# Patient Record
Sex: Male | Born: 1945 | Race: White | Hispanic: No | Marital: Married | Smoking: Never smoker
Health system: Southern US, Community
[De-identification: ages and names within clinical notes are randomized; demographics above are authoritative.]

## PROBLEM LIST (undated history)

## (undated) DIAGNOSIS — E119 Type 2 diabetes mellitus without complications: Secondary | ICD-10-CM

## (undated) DIAGNOSIS — G4730 Sleep apnea, unspecified: Secondary | ICD-10-CM

## (undated) DIAGNOSIS — N400 Enlarged prostate without lower urinary tract symptoms: Secondary | ICD-10-CM

## (undated) DIAGNOSIS — I10 Essential (primary) hypertension: Secondary | ICD-10-CM

## (undated) DIAGNOSIS — E785 Hyperlipidemia, unspecified: Secondary | ICD-10-CM

## (undated) DIAGNOSIS — J189 Pneumonia, unspecified organism: Secondary | ICD-10-CM

## (undated) DIAGNOSIS — H269 Unspecified cataract: Secondary | ICD-10-CM

## (undated) HISTORY — PX: PROSTATE SURGERY: SHX751

## (undated) HISTORY — DX: Unspecified cataract: H26.9

## (undated) HISTORY — DX: Benign prostatic hyperplasia without lower urinary tract symptoms: N40.0

## (undated) HISTORY — DX: Essential (primary) hypertension: I10

## (undated) HISTORY — DX: Pneumonia, unspecified organism: J18.9

## (undated) HISTORY — PX: CHOLECYSTECTOMY: SHX55

## (undated) HISTORY — DX: Type 2 diabetes mellitus without complications: E11.9

## (undated) HISTORY — DX: Hyperlipidemia, unspecified: E78.5

---

## 2004-08-01 ENCOUNTER — Ambulatory Visit: Payer: Self-pay | Admitting: Internal Medicine

## 2013-02-11 DIAGNOSIS — J189 Pneumonia, unspecified organism: Secondary | ICD-10-CM

## 2013-02-11 HISTORY — PX: EYE SURGERY: SHX253

## 2013-02-11 HISTORY — DX: Pneumonia, unspecified organism: J18.9

## 2013-03-11 LAB — CBC WITH DIFFERENTIAL/PLATELET
BASOS PCT: 0.6 %
Basophil #: 0.1 10*3/uL (ref 0.0–0.1)
EOS ABS: 0.1 10*3/uL (ref 0.0–0.7)
Eosinophil %: 0.7 %
HCT: 37.9 % — ABNORMAL LOW (ref 40.0–52.0)
HGB: 12.2 g/dL — ABNORMAL LOW (ref 13.0–18.0)
Lymphocyte #: 0.7 10*3/uL — ABNORMAL LOW (ref 1.0–3.6)
Lymphocyte %: 7.2 %
MCH: 28.9 pg (ref 26.0–34.0)
MCHC: 32.2 g/dL (ref 32.0–36.0)
MCV: 90 fL (ref 80–100)
MONO ABS: 0.8 x10 3/mm (ref 0.2–1.0)
MONOS PCT: 8.4 %
Neutrophil #: 8.4 10*3/uL — ABNORMAL HIGH (ref 1.4–6.5)
Neutrophil %: 83.1 %
Platelet: 204 10*3/uL (ref 150–440)
RBC: 4.23 10*6/uL — ABNORMAL LOW (ref 4.40–5.90)
RDW: 15.3 % — AB (ref 11.5–14.5)
WBC: 10.1 10*3/uL (ref 3.8–10.6)

## 2013-03-11 LAB — COMPREHENSIVE METABOLIC PANEL
ALK PHOS: 77 U/L
ANION GAP: 6 — AB (ref 7–16)
Albumin: 3.4 g/dL (ref 3.4–5.0)
BUN: 15 mg/dL (ref 7–18)
Bilirubin,Total: 0.7 mg/dL (ref 0.2–1.0)
Calcium, Total: 8.5 mg/dL (ref 8.5–10.1)
Chloride: 101 mmol/L (ref 98–107)
Co2: 26 mmol/L (ref 21–32)
Creatinine: 1.18 mg/dL (ref 0.60–1.30)
EGFR (African American): >60
EGFR (Non-African Amer.): >60
GLUCOSE: 250 mg/dL — AB (ref 65–99)
Osmolality: 276 (ref 275–301)
POTASSIUM: 3.9 mmol/L (ref 3.5–5.1)
SGOT(AST): 32 U/L (ref 15–37)
SGPT (ALT): 46 U/L (ref 12–78)
Sodium: 133 mmol/L — ABNORMAL LOW (ref 136–145)
Total Protein: 7 g/dL (ref 6.4–8.2)

## 2013-03-11 LAB — RAPID INFLUENZA A&B ANTIGENS (ARMC ONLY)

## 2013-03-11 LAB — TROPONIN I: Troponin-I: <0.02 ng/mL

## 2013-03-12 ENCOUNTER — Inpatient Hospital Stay: Payer: Self-pay | Admitting: Internal Medicine

## 2013-03-12 LAB — URINALYSIS, COMPLETE
Bacteria: NONE SEEN
Bilirubin,UR: NEGATIVE
Blood: NEGATIVE
Glucose,UR: >=500 mg/dL (ref 0–75)
Leukocyte Esterase: NEGATIVE
NITRITE: NEGATIVE
Ph: 5 (ref 4.5–8.0)
Protein: NEGATIVE
RBC,UR: <1 /HPF (ref 0–5)
SPECIFIC GRAVITY: 1.026 (ref 1.003–1.030)
WBC UR: NONE SEEN /HPF (ref 0–5)

## 2013-03-13 LAB — URINE CULTURE

## 2013-03-16 LAB — CULTURE, BLOOD (SINGLE)

## 2013-03-16 LAB — EXPECTORATED SPUTUM ASSESSMENT W REFEX TO RESP CULTURE

## 2013-03-26 ENCOUNTER — Ambulatory Visit: Payer: Self-pay

## 2013-03-26 ENCOUNTER — Ambulatory Visit: Payer: Self-pay | Admitting: Internal Medicine

## 2013-04-14 ENCOUNTER — Ambulatory Visit: Payer: Self-pay | Admitting: Ophthalmology

## 2013-06-30 ENCOUNTER — Ambulatory Visit: Payer: Self-pay | Admitting: Ophthalmology

## 2013-09-23 ENCOUNTER — Ambulatory Visit: Payer: Self-pay | Admitting: Urology

## 2013-09-23 LAB — BASIC METABOLIC PANEL
Anion Gap: 6 — ABNORMAL LOW (ref 7–16)
BUN: 12 mg/dL (ref 7–18)
Calcium, Total: 9 mg/dL (ref 8.5–10.1)
Chloride: 105 mmol/L (ref 98–107)
Co2: 28 mmol/L (ref 21–32)
Creatinine: 1.2 mg/dL (ref 0.60–1.30)
EGFR (African American): >60
Glucose: 103 mg/dL — ABNORMAL HIGH (ref 65–99)
Osmolality: 278 (ref 275–301)
Potassium: 3.9 mmol/L (ref 3.5–5.1)
Sodium: 139 mmol/L (ref 136–145)

## 2013-09-23 LAB — CBC
HCT: 39 % — AB (ref 40.0–52.0)
HGB: 12.7 g/dL — ABNORMAL LOW (ref 13.0–18.0)
MCH: 28.1 pg (ref 26.0–34.0)
MCHC: 32.6 g/dL (ref 32.0–36.0)
MCV: 86 fL (ref 80–100)
PLATELETS: 250 10*3/uL (ref 150–440)
RBC: 4.52 10*6/uL (ref 4.40–5.90)
RDW: 16.9 % — ABNORMAL HIGH (ref 11.5–14.5)
WBC: 6.1 10*3/uL (ref 3.8–10.6)

## 2013-10-08 ENCOUNTER — Ambulatory Visit: Payer: Self-pay | Admitting: Urology

## 2013-10-08 LAB — BASIC METABOLIC PANEL
Anion Gap: 6 — ABNORMAL LOW (ref 7–16)
BUN: 14 mg/dL (ref 7–18)
Calcium, Total: 8 mg/dL — ABNORMAL LOW (ref 8.5–10.1)
Chloride: 112 mmol/L — ABNORMAL HIGH (ref 98–107)
Co2: 23 mmol/L (ref 21–32)
Creatinine: 0.9 mg/dL (ref 0.60–1.30)
EGFR (Non-African Amer.): >60
GLUCOSE: 188 mg/dL — AB (ref 65–99)
Osmolality: 287 (ref 275–301)
POTASSIUM: 4.2 mmol/L (ref 3.5–5.1)
Sodium: 141 mmol/L (ref 136–145)

## 2013-10-14 LAB — PATHOLOGY REPORT

## 2014-05-27 ENCOUNTER — Ambulatory Visit
Admit: 2014-05-27 | Disposition: A | Discharge status: Home / Self Care | Payer: Self-pay | Attending: Internal Medicine | Admitting: Internal Medicine

## 2014-06-01 ENCOUNTER — Encounter: Payer: Self-pay | Admitting: *Deleted

## 2014-06-04 NOTE — Op Note (Signed)
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    SURGEON:  Zebulon Gantt D. Elnoria Howard, M.D.   PROCEDURE: Cystourethroscopy with KTP laser ablation of the prostate followed by a saline resection and control of bleeding. The patient was sterilely prepped and draped in the supine lithotomy position.  After appropriate timeout and with good relaxation from a general anesthetic, we began. He appears to just have a very large middle lobe with minimal lateral lobe hypertrophy.  I began the KTP laser on the middle lobe; however, there was some bleeding as we progress with this and I no longer could see so I used a saline resectoscope and resected the tissue with control of bleeding. At the end of the procedure, the bleeding was controlled and the middle lobe was pretty well-resected as was some of the lateral lobe. We kept the verumontanum in sight throughout and did not progress toward the ureters at all. There was good resection down to the base of the tissue. The bleeding seemed to be controlled.  I used both the  loop and the button to control bleeding. Then I was able to put a 24 [place] 3-way catheter in to irrigation.  The irrigation was light pink to clear at the end so we sent him to recovery in satisfactory condition.    Blood loss was about 40 mL.   Rectal exam for B and O suppository at the end reveals an enlarged prostate, grade 2/4, but benign; no nodules, masses, or growths and no rectal masses.    ____________________________ Janice Coffin. Elnoria Howard, DO rdh:nr D: 10/08/2013 16:14:42 ET T: 10/08/2013 22:28:56 ET JOB#: [id number]  cc: Janice Coffin. Elnoria Howard, DO, <Dictator> Talayeh Bruinsma D Sheza Strickland DO ELECTRONICALLY SIGNED 10/22/2013 15:05

## 2014-06-04 NOTE — Discharge Summary (Signed)
PATIENT NAME:  Andre Edwards, Andre Edwards MR#:  [id number] DATE OF BIRTH:  [birth year]  DATE OF ADMISSION:  03/12/2013 DATE OF DISCHARGE:  03/14/2013  FINAL DIAGNOSES: 1. Pneumonia.  2. Acute respiratory failure secondary to #1.  3. Adult onset diabetes mellitus, uncontrolled.  4. Hypertension.   HISTORY AND PHYSICAL: Please see dictated admission history and physical.   HOSPITAL COURSE: The patient was admitted with evidence of pneumonia on chest x-ray, left lower lobe infiltrate, with 4 to 5 days of worsening symptoms. At the time of admission he was noted to have heart rate over 90, respiratory rate 20, and required 3 liters nasal cannula for support. Fortunately, he responded rapidly to nebulizer treatments, antibiotics. He was weaned off of oxygen, and placed on nebulizers only as needed, and change over to oral antibiotics. He tolerated this well also. He was ambulated personally, and did well with this, feeling like he was close to baseline ambulation. He had minimal cough, and breathing was markedly improved. He felt ready to go home and we discharged him in stable condition with physical activity to be up as tolerated. We will have him out of work for one week, he will follow-up with Dr. Doy Hutching in the next one week. He should follow a 2 gram sodium, 1800 calorie ADA diet. He should check his sugar daily and record this.   DISCHARGE MEDICATIONS: 1. Cardizem CD 240 mg p.o. daily.  2. Glipizide 10 mg p.o. daily.  3. Metformin 1000 mg p.o. b.Edwards.d.  4. Hydrochlorothiazide  25 mg p.o. daily.  5. Atorvastatin 10 mg p.o. at bedtime.  6. Levaquin 500 mg p.o. daily x 8 days course.  7. Ciprodex otic two drops to the ear 4 times a day x 5 days.  8. Albuterol metered-dose inhaler 2 puffs 4 times a day as needed for wheezing.   ____________________________ Adin Hector, MD bjk:sg D: 03/14/2013 09:14:10 ET T: 03/14/2013 09:57:54 ET JOB#: [id number]  cc: Adin Hector, MD, <Dictator> Leonie Douglas.  Doy Hutching, MD  Ramonita Lab MD ELECTRONICALLY SIGNED 03/15/2013 20:44

## 2014-06-04 NOTE — H&P (Signed)
PATIENT NAME:  Andre Edwards, Andre Edwards MR#:  [id number] DATE OF BIRTH:  February 18, 1945  DATE OF ADMISSION:  03/12/2013  REFERRING PHYSICIAN:  Dr. Cinda Quest.   PRIMARY CARE PHYSICIAN: Nonlocal.   CHIEF COMPLAINT: Shortness of breath.   HISTORY OF PRESENT ILLNESS: A 69 year old gentleman with history of hypertension, type 2 diabetes non-insulin requiring, presented with shortness of breath, describes 4 to 5 day duration of cough which is productive of sputum. He is unsure of the exact quality and nature of sputum as he does not look at it. However, his symptoms have been worsening. He now notes a one day duration of associated fevers and chills. He has noted positive sick contacts of multiple family members, supposedly having the flu.  His PCP started him on Tamiflu just today. He has been taking one dose, thus far, currently without complaints.    REVIEW OF SYSTEMS: CONSTITUTIONAL: Positive for fevers, fatigue, generalized weakness. Denies pain or weight changes.  EYES: Denies blurred vision, double vision, eye pain.  EARS, NOSE, THROAT: Denies any tinnitus, ear pain, hearing loss. RESPIRATORY: Positive for cough, as well as dyspnea on exertion.  CARDIOVASCULAR: Denies chest pain, palpitations, edema.  GASTROINTESTINAL: Denies nausea, vomiting, diarrhea, abdominal pain.  GENITOURINARY: Denies dysuria, hematuria.  ENDOCRINE: Denies nocturia or thyroid problems. HEMATOLOGIC AND LYMPHATIC: Denies easy bruising, bleeding.  SKIN: Denies rash or lesion.  MUSCULOSKELETAL: Denies pain in neck, back, shoulder, knees, hips or arthritic symptoms.  NEUROLOGIC: Denies paralysis, paresthesias. PSYCHIATRIC: Denies anxiety or depressive symptoms.  Otherwise, full review of systems performed by me is negative.   PAST MEDICAL HISTORY:  Hypertension, diabetes.   SOCIAL HISTORY: Occasional alcohol use. He uses smokeless tobacco. Denies any drug use.   FAMILY HISTORY: Positive for hypertension.   ALLERGIES: No known  drug allergies.   HOME MEDICATIONS: He states that he is on some pills for diabetes, as well as some for hypertension; however, he does not know any of his medications at this time.   PHYSICAL EXAMINATION: VITAL SIGNS: Temperature 100.6 degrees Fahrenheit, heart rate 95, respirations 20, blood pressure 155/92, saturating 98% on room air. Currently saturating 96% on 3 liters nasal cannula.  GENERAL: Well-nourished, well-developed, Caucasian gentleman who appears ill.  HEAD: Normocephalic, atraumatic.  EYES: Pupils equal, round and reactive to light. Extraocular muscles intact.  No scleral icterus.  MOUTH: Moist mucosal membranes. Dentition intact. No abscess noted.  EARS, NOSE, THROAT: Clear without exudates. No external lesions.  NECK: Supple. No thyromegaly. No nodules. No JVD.  PULMONARY: Diffuse bilateral coarse breath sounds with scattered rhonchi, most prominent the left lower lobe. No use of accessory muscles. Good respiratory effort.  CHEST: Nontender to palpation.  CARDIOVASCULAR: S1, S2, regular rate and rhythm. No murmurs, rubs or gallops. No edema. Pedal pulse 2+ bilaterally.  GASTROINTESTINAL: Soft, nontender, nondistended. No masses, positive bowel sounds, no  hepatosplenomegaly.  MUSCULOSKELETAL: No swelling, clubbing, edema. Range of motion full in all extremities.  NEUROLOGIC: Cranial nerves II through XII intact. No gross focal neurological deficits. Sensation and reflexes intact.  SKIN: No ulcerations, lesions, rash or cyanosis. Skin is warm, dry. Turgor intact.  PSYCHIATRIC: Mood and affect within normal limits. Awake, alert and oriented x 3. Insight and judgment intact.   LABORATORY DATA: Sodium 133, potassium 3.9, chloride 101, bicarb 26, BUN 15, creatinine 1.18, glucose 250. LFTs within normal limits. Troponin Edwards less than 0.02. WBC 10.1, hemoglobin 12.2, platelets 204. Influenza negative. Urinalysis negative for evidence of infection. Chest x-ray revealing  ill-defined left  base opacity  concerning for pneumonia.   ASSESSMENT AND PLAN: A 69 year old gentleman with history of hypertension and diabetes, presenting with shortness of breath and cough.  1.  Community-acquired pneumonia. Provide supplemental O2 to keep oxygen saturation greater than 92%.  DuoNeb therapy q.4 hours, flutter valve therapy, sputum culture and blood cultures obtained. Antibiotic coverage with ceftriaxone and azithromycin for community-acquired pneumonia. He was recently positive for flu contacts. We will continue Tamiflu as started as an outpatient for prophylactic treatment.  2.  Diabetes: Hold p.o. agents. Add insulin sliding scale with q.6 hour Accu-Cheks.  3.  Hypertension. The patient is on unknown medication. Restart in the morning when he bring his medication list. In the meantime, continue his p.r.n. hydralazine, if required, for hypertension.  4.  Deep venous thrombosis prophylaxis with heparin subcutaneous.  5.  The patient is full code.   TIME SPENT: 45 minutes.    ____________________________ Aaron Mose. Hower, MD dkh:NTS D: 03/12/2013 00:51:00 ET T: 03/12/2013 02:01:52 ET JOB#: [id number]  cc: Aaron Mose. Hower, MD, <Dictator> DAVID Woodfin Ganja MD ELECTRONICALLY SIGNED 03/12/2013 3:52

## 2014-06-13 ENCOUNTER — Encounter: Payer: Self-pay | Admitting: General Surgery

## 2014-06-13 ENCOUNTER — Telehealth: Payer: Self-pay | Admitting: *Deleted

## 2014-06-13 ENCOUNTER — Ambulatory Visit (INDEPENDENT_AMBULATORY_CARE_PROVIDER_SITE_OTHER): Payer: BLUE CROSS/BLUE SHIELD | Admitting: General Surgery

## 2014-06-13 DIAGNOSIS — K8010 Calculus of gallbladder with chronic cholecystitis without obstruction: Secondary | ICD-10-CM

## 2014-06-13 NOTE — Patient Instructions (Addendum)

## 2014-06-13 NOTE — Progress Notes (Signed)
Patient ID: Andre Edwards, male   DOB: 01/20/1946, 69 y.o.   MRN: [id number]  Chief Complaint  Patient presents with  . Other    Eval Gallstones    HPI Andre Edwards is a 69 y.o. male here for evaluation of abdominal pain and gallstones. He first noticed the pain in his right shoulder blade and under his right ribs. He states that this pain would come and go and at times it would hurt all day. He states that the pain has gotten better. Some days he does not have any problems. He reports no problems with using the bathroom. He reports no abdominal bloating. He was seen by his PCP for this and had an ultrasound done on 05/27/14 showing multiple mobile gallstones.  HPI  Past Medical History  Diagnosis Date  . Cataract   . Diabetes mellitus without complication   . Pneumonia 02/2013  . Enlarged prostate   . Hypertension   . Hyperlipidemia     Past Surgical History  Procedure Laterality Date  . Eye surgery Bilateral 2015    History reviewed. No pertinent family history.  Social History History  Substance Use Topics  . Smoking status: Never Smoker   . Smokeless tobacco: Current User  . Alcohol Use: No    Allergies  Allergen Reactions  . Erythromycin Itching    Current Outpatient Prescriptions  Medication Sig Dispense Refill  . aspirin 81 MG chewable tablet Chew 81 mg by mouth daily.    Marland Kitchen atorvastatin (LIPITOR) 10 MG tablet Take 10 mg by mouth daily.    . canagliflozin (INVOKANA) 300 MG TABS tablet Take by mouth.    . cetirizine (ZYRTEC) 10 MG tablet Take 10 mg by mouth daily.    . Chromium-Cinnamon 50-500 MCG-MG CAPS Take 500 mg/kg/day by mouth.    . diltiazem (CARDIZEM LA) 120 MG 24 hr tablet Take 240 mg by mouth daily.    Marland Kitchen docusate sodium (COLACE) 100 MG capsule Take 100 mg by mouth 2 (two) times daily.    Marland Kitchen glipiZIDE (GLUCOTROL) 10 MG tablet Take 10 mg by mouth daily before breakfast.    . hydrochlorothiazide (HYDRODIURIL) 25 MG tablet Take 25 mg by mouth daily.    .  metFORMIN (GLUCOPHAGE) 1000 MG tablet Take 1,000 mg by mouth 2 (two) times daily with a meal.    . phenylephrine (SUDAFED PE) 10 MG TABS tablet Take 10 mg by mouth every 4 (four) hours as needed.    . tamsulosin (FLOMAX) 0.4 MG CAPS capsule Take 0.4 mg by mouth.    . vitamin B-12 (CYANOCOBALAMIN) 1000 MCG tablet Take 1,000 mcg by mouth daily.     No current facility-administered medications for this visit.    Review of Systems Review of Systems  Constitutional: Negative.   Respiratory: Negative.   Cardiovascular: Negative.   Gastrointestinal: Positive for abdominal pain (under right ribs). Negative for nausea, vomiting, diarrhea, constipation, blood in stool, abdominal distention, anal bleeding and rectal pain.    Blood pressure 122/84, pulse 80, resp. rate 14, height 5\' 10"  (1.778 m), weight 228 lb (103.42 kg).  Physical Exam Physical Exam  Constitutional: He is oriented to person, place, and time. He appears well-developed and well-nourished.  Eyes: Conjunctivae are normal. No scleral icterus.  Neck: Neck supple.  Cardiovascular: Normal rate, regular rhythm and normal heart sounds.   Pulmonary/Chest: Effort normal and breath sounds normal.  Abdominal: Soft. Normal appearance. There is no hepatomegaly. There is no tenderness. There is negative Murphy's  sign.    Lymphadenopathy:    He has no cervical adenopathy.  Neurological: He is alert and oriented to person, place, and time.  Skin: Skin is warm and dry.    Data Reviewed LFTs were normal. Ultrasound showing multiple gallstones.  Assessment    Symptomatic cholelithiasis      Plan    Laparoscopy cholecystectomy  Laparoscopic Cholecystectomy with Intraoperative Cholangiogram. The procedure, including it's potential risks and complications (including but not limited to infection, bleeding, injury to intra-abdominal organs or bile ducts, bile leak, poor cosmetic result, sepsis and death) were discussed with the patient in  detail. Non-operative options, including their inherent risks (acute calculous cholecystitis with possible choledocholithiasis or gallstone pancreatitis, with the risk of ascending cholangitis, sepsis, and death) were discussed as well. The patient expressed and understanding of what we discussed and wishes to proceed with laparoscopic cholecystectomy. The patient further understands that if it is technically not possible, or it is unsafe to proceed laparoscopically, that I will convert to an open cholecystectomy.     Patient's surgery has been scheduled for 07-12-14 at [facility]. It is okay for patient to continue 81 mg aspirin once daily.  Ref/PCP: Dr Octavio Manns 06/13/2014, 1:15 PM

## 2014-06-13 NOTE — Telephone Encounter (Signed)
Patient called to reschedule surgery from 07-12-14 to 07-04-14 at [facility].   Form faxed with date change info to [facility].

## 2014-06-20 ENCOUNTER — Encounter
Admission: RE | Admit: 2014-06-20 | Discharge: 2014-06-20 | Disposition: A | Discharge status: Home / Self Care | Payer: BLUE CROSS/BLUE SHIELD | Source: Ambulatory Visit | Attending: General Surgery | Admitting: General Surgery

## 2014-06-20 DIAGNOSIS — E119 Type 2 diabetes mellitus without complications: Secondary | ICD-10-CM | POA: Insufficient documentation

## 2014-06-20 DIAGNOSIS — N400 Enlarged prostate without lower urinary tract symptoms: Secondary | ICD-10-CM | POA: Insufficient documentation

## 2014-06-20 DIAGNOSIS — Z01810 Encounter for preprocedural cardiovascular examination: Secondary | ICD-10-CM | POA: Diagnosis not present

## 2014-06-20 DIAGNOSIS — G4730 Sleep apnea, unspecified: Secondary | ICD-10-CM | POA: Diagnosis not present

## 2014-06-20 DIAGNOSIS — E785 Hyperlipidemia, unspecified: Secondary | ICD-10-CM | POA: Diagnosis not present

## 2014-06-20 DIAGNOSIS — I10 Essential (primary) hypertension: Secondary | ICD-10-CM | POA: Diagnosis not present

## 2014-06-20 HISTORY — DX: Sleep apnea, unspecified: G47.30

## 2014-06-20 LAB — DIFFERENTIAL
Basophils Absolute: 0.1 10*3/uL (ref 0–0.1)
Basophils Relative: 1 %
Eosinophils Absolute: 0.3 10*3/uL (ref 0–0.7)
Eosinophils Relative: 5 %
LYMPHS PCT: 25 %
Lymphs Abs: 1.5 10*3/uL (ref 1.0–3.6)
MONOS PCT: 12 %
Monocytes Absolute: 0.7 10*3/uL (ref 0.2–1.0)
NEUTROS ABS: 3.5 10*3/uL (ref 1.4–6.5)
Neutrophils Relative %: 57 %

## 2014-06-20 LAB — CBC
HCT: 36.8 % — ABNORMAL LOW (ref 40.0–52.0)
HEMOGLOBIN: 11.7 g/dL — AB (ref 13.0–18.0)
MCH: 25.5 pg — ABNORMAL LOW (ref 26.0–34.0)
MCHC: 31.8 g/dL — ABNORMAL LOW (ref 32.0–36.0)
MCV: 80.1 fL (ref 80.0–100.0)
Platelets: 237 10*3/uL (ref 150–440)
RBC: 4.59 MIL/uL (ref 4.40–5.90)
RDW: 18.6 % — ABNORMAL HIGH (ref 11.5–14.5)
WBC: 6.1 10*3/uL (ref 3.8–10.6)

## 2014-06-20 LAB — BASIC METABOLIC PANEL
Anion gap: 10 (ref 5–15)
BUN: 19 mg/dL (ref 6–20)
CHLORIDE: 103 mmol/L (ref 101–111)
CO2: 27 mmol/L (ref 22–32)
CREATININE: 1.01 mg/dL (ref 0.61–1.24)
Calcium: 9.3 mg/dL (ref 8.9–10.3)
GFR calc Af Amer: >60 mL/min (ref >60)
GFR calc non Af Amer: >60 mL/min (ref >60)
Glucose, Bld: 80 mg/dL (ref 65–99)
Potassium: 4 mmol/L (ref 3.5–5.1)
Sodium: 140 mmol/L (ref 135–145)

## 2014-06-20 NOTE — Patient Instructions (Addendum)
  Your procedure is scheduled on: Jul 04, 2014 Report to Same Day Surgery. To find out your arrival time please call [phone] between 1PM - 3PM on Jul 01, 2014 (Friday)  Remember: Instructions that are not followed completely may result in serious medical risk, up to and including death, or upon the discretion of your surgeon and anesthesiologist your surgery may need to be rescheduled.    ____ 1. Do not eat food or drink liquids after midnight. No gum chewing or hard candies.     ____ 2. No Alcohol for 24 hours before or after surgery.   ____ 3. Bring all medications with you on the day of surgery if instructed.    ____ 4. Notify your doctor if there is any change in your medical condition     (cold, fever, infections).     Do not wear jewelry, make-up, hairpins, clips or nail polish.  Do not wear lotions, powders, or perfumes. You may wear deodorant.  Do not shave 48 hours prior to surgery. Men may shave face and neck.  Do not bring valuables to the hospital.    [facility] is not responsible for any belongings or valuables.               Contacts, dentures or bridgework may not be worn into surgery.  Leave your suitcase in the car. After surgery it may be brought to your room.  For patients admitted to the hospital, discharge time is determined by your                treatment team.   Patients discharged the day of surgery will not be allowed to drive home.   Please read over the following fact sheets that you were given:   Surgical Site Infection Prevention   ____ Take these medicines the morning of surgery with A SIP OF WATER:    1.Diltiazem   2.   3.   4.  5.  6.  ____ Fleet Enema (as directed)   ____ Use CHG Soap as directed  ____ Use inhalers on the day of surgery  ____ Stop metformin 2 days prior to surgery (Stop on Jul 02, 2014)    ____ Take 1/2 of usual insulin dose the night before surgery and none on the morning of surgery.   ____ Stop  Coumadin/Plavix/aspirin on now  ____ Stop Anti-inflammatories on  ____ Stop supplements until after surgery.    ____ Bring C-Pap to the hospital.

## 2014-06-29 ENCOUNTER — Encounter: Payer: Self-pay | Admitting: *Deleted

## 2014-06-29 DIAGNOSIS — N400 Enlarged prostate without lower urinary tract symptoms: Secondary | ICD-10-CM | POA: Insufficient documentation

## 2014-06-29 DIAGNOSIS — T300 Burn of unspecified body region, unspecified degree: Secondary | ICD-10-CM | POA: Insufficient documentation

## 2014-06-29 DIAGNOSIS — Z9109 Other allergy status, other than to drugs and biological substances: Secondary | ICD-10-CM | POA: Insufficient documentation

## 2014-06-29 DIAGNOSIS — I10 Essential (primary) hypertension: Secondary | ICD-10-CM | POA: Insufficient documentation

## 2014-06-29 DIAGNOSIS — I878 Other specified disorders of veins: Secondary | ICD-10-CM | POA: Insufficient documentation

## 2014-06-29 DIAGNOSIS — R972 Elevated prostate specific antigen [PSA]: Secondary | ICD-10-CM | POA: Insufficient documentation

## 2014-06-29 DIAGNOSIS — E785 Hyperlipidemia, unspecified: Secondary | ICD-10-CM | POA: Insufficient documentation

## 2014-06-29 DIAGNOSIS — E119 Type 2 diabetes mellitus without complications: Secondary | ICD-10-CM | POA: Insufficient documentation

## 2014-07-04 ENCOUNTER — Ambulatory Visit: Payer: BLUE CROSS/BLUE SHIELD | Admitting: Anesthesiology

## 2014-07-04 ENCOUNTER — Encounter: Payer: Self-pay | Admitting: Emergency Medicine

## 2014-07-04 ENCOUNTER — Emergency Department
Admission: EM | Admit: 2014-07-04 | Discharge: 2014-07-05 | Disposition: A | Discharge status: Home / Self Care | Payer: BLUE CROSS/BLUE SHIELD | Attending: Emergency Medicine | Admitting: Emergency Medicine

## 2014-07-04 ENCOUNTER — Ambulatory Visit: Payer: BLUE CROSS/BLUE SHIELD

## 2014-07-04 ENCOUNTER — Ambulatory Visit
Admission: RE | Admit: 2014-07-04 | Discharge: 2014-07-04 | Disposition: A | Discharge status: Home / Self Care | Payer: BLUE CROSS/BLUE SHIELD | Source: Ambulatory Visit | Attending: General Surgery | Admitting: General Surgery

## 2014-07-04 ENCOUNTER — Encounter
Admission: RE | Disposition: A | Discharge status: Home / Self Care | Payer: Self-pay | Source: Ambulatory Visit | Attending: General Surgery

## 2014-07-04 DIAGNOSIS — I10 Essential (primary) hypertension: Secondary | ICD-10-CM | POA: Diagnosis not present

## 2014-07-04 DIAGNOSIS — K8010 Calculus of gallbladder with chronic cholecystitis without obstruction: Secondary | ICD-10-CM | POA: Diagnosis not present

## 2014-07-04 DIAGNOSIS — M25511 Pain in right shoulder: Secondary | ICD-10-CM | POA: Diagnosis not present

## 2014-07-04 DIAGNOSIS — E785 Hyperlipidemia, unspecified: Secondary | ICD-10-CM | POA: Diagnosis not present

## 2014-07-04 DIAGNOSIS — E119 Type 2 diabetes mellitus without complications: Secondary | ICD-10-CM | POA: Diagnosis not present

## 2014-07-04 DIAGNOSIS — K8020 Calculus of gallbladder without cholecystitis without obstruction: Secondary | ICD-10-CM

## 2014-07-04 DIAGNOSIS — Z79899 Other long term (current) drug therapy: Secondary | ICD-10-CM | POA: Diagnosis not present

## 2014-07-04 DIAGNOSIS — J9811 Atelectasis: Secondary | ICD-10-CM | POA: Diagnosis not present

## 2014-07-04 DIAGNOSIS — N400 Enlarged prostate without lower urinary tract symptoms: Secondary | ICD-10-CM | POA: Diagnosis not present

## 2014-07-04 DIAGNOSIS — G4730 Sleep apnea, unspecified: Secondary | ICD-10-CM | POA: Insufficient documentation

## 2014-07-04 DIAGNOSIS — Z7982 Long term (current) use of aspirin: Secondary | ICD-10-CM | POA: Insufficient documentation

## 2014-07-04 DIAGNOSIS — Z9049 Acquired absence of other specified parts of digestive tract: Secondary | ICD-10-CM | POA: Diagnosis not present

## 2014-07-04 DIAGNOSIS — R339 Retention of urine, unspecified: Secondary | ICD-10-CM | POA: Diagnosis not present

## 2014-07-04 DIAGNOSIS — Z881 Allergy status to other antibiotic agents status: Secondary | ICD-10-CM | POA: Insufficient documentation

## 2014-07-04 HISTORY — PX: CHOLECYSTECTOMY: SHX55

## 2014-07-04 LAB — GLUCOSE, CAPILLARY: GLUCOSE-CAPILLARY: 137 mg/dL — AB (ref 65–99)

## 2014-07-04 SURGERY — LAPAROSCOPIC CHOLECYSTECTOMY
Anesthesia: General | Wound class: Clean Contaminated

## 2014-07-04 MED ORDER — OXYCODONE-ACETAMINOPHEN 5-325 MG PO TABS
1.0000 | ORAL_TABLET | ORAL | Status: DC | PRN
  Administered 2014-07-04: 1 via ORAL

## 2014-07-04 MED ORDER — MIDAZOLAM HCL 5 MG/5ML IJ SOLN
1.0000 mg | Freq: Once | INTRAMUSCULAR | Status: AC
  Administered 2014-07-04: 1 mg via INTRAVENOUS

## 2014-07-04 MED ORDER — LACTATED RINGERS IR SOLN
Status: DC | PRN
  Administered 2014-07-04: 300 mL

## 2014-07-04 MED ORDER — PHENYLEPHRINE HCL 10 MG/ML IJ SOLN
INTRAMUSCULAR | Status: DC | PRN
  Administered 2014-07-04: 100 ug via INTRAVENOUS

## 2014-07-04 MED ORDER — IOTHALAMATE MEGLUMINE 60 % INJ SOLN: INTRAMUSCULAR | Status: DC | PRN

## 2014-07-04 MED ORDER — HYDROMORPHONE HCL 1 MG/ML IJ SOLN
0.2500 mg | INTRAMUSCULAR | Status: DC | PRN
  Administered 2014-07-04 (×4): 0.5 mg via INTRAVENOUS

## 2014-07-04 MED ORDER — CEFAZOLIN SODIUM-DEXTROSE 2-3 GM-% IV SOLR
2.0000 g | INTRAVENOUS | Status: AC
  Administered 2014-07-04: 2 g via INTRAVENOUS

## 2014-07-04 MED ORDER — FENTANYL CITRATE (PF) 100 MCG/2ML IJ SOLN
INTRAMUSCULAR | Status: DC | PRN
  Administered 2014-07-04 (×2): 50 ug via INTRAVENOUS
  Administered 2014-07-04: 25 ug via INTRAVENOUS
  Administered 2014-07-04: 50 ug via INTRAVENOUS
  Administered 2014-07-04: 25 ug via INTRAVENOUS
  Administered 2014-07-04 (×3): 50 ug via INTRAVENOUS

## 2014-07-04 MED ORDER — CEFAZOLIN SODIUM-DEXTROSE 2-3 GM-% IV SOLR
INTRAVENOUS | Status: AC
  Administered 2014-07-04: 2 g via INTRAVENOUS
  Filled 2014-07-04: qty 50

## 2014-07-04 MED ORDER — NEOSTIGMINE METHYLSULFATE 10 MG/10ML IV SOLN
INTRAVENOUS | Status: DC | PRN
  Administered 2014-07-04: 2 mg via INTRAVENOUS
  Administered 2014-07-04: 3 mg via INTRAVENOUS

## 2014-07-04 MED ORDER — ONDANSETRON HCL 4 MG/2ML IJ SOLN: 4.0000 mg | Freq: Once | INTRAMUSCULAR | Status: DC | PRN

## 2014-07-04 MED ORDER — LIDOCAINE HCL (CARDIAC) 20 MG/ML IV SOLN
INTRAVENOUS | Status: DC | PRN
  Administered 2014-07-04: 60 mg via INTRAVENOUS

## 2014-07-04 MED ORDER — MIDAZOLAM HCL 2 MG/2ML IJ SOLN
INTRAMUSCULAR | Status: DC | PRN
  Administered 2014-07-04: 2 mg via INTRAVENOUS

## 2014-07-04 MED ORDER — SODIUM CHLORIDE 0.9 % IV SOLN
INTRAVENOUS | Status: DC
  Administered 2014-07-04: 07:00:00 via INTRAVENOUS

## 2014-07-04 MED ORDER — THROMBIN 5000 UNITS EX SOLR
CUTANEOUS | Status: AC
  Filled 2014-07-04: qty 5000

## 2014-07-04 MED ORDER — SODIUM CHLORIDE 0.9 % IV SOLN
INTRAVENOUS | Status: DC | PRN
  Administered 2014-07-04: 9 mL

## 2014-07-04 MED ORDER — HYDROMORPHONE HCL 1 MG/ML IJ SOLN
INTRAMUSCULAR | Status: AC
  Administered 2014-07-04: 0.5 mg via INTRAVENOUS
  Filled 2014-07-04: qty 1

## 2014-07-04 MED ORDER — SODIUM CHLORIDE 0.9 % IJ SOLN
INTRAMUSCULAR | Status: AC
  Filled 2014-07-04: qty 50

## 2014-07-04 MED ORDER — OXYCODONE-ACETAMINOPHEN 5-325 MG PO TABS: 1.0000 | ORAL_TABLET | ORAL | Status: DC | PRN

## 2014-07-04 MED ORDER — ROCURONIUM BROMIDE 100 MG/10ML IV SOLN
INTRAVENOUS | Status: DC | PRN
  Administered 2014-07-04: 40 mg via INTRAVENOUS
  Administered 2014-07-04 (×2): 10 mg via INTRAVENOUS

## 2014-07-04 MED ORDER — ACETAMINOPHEN 10 MG/ML IV SOLN
INTRAVENOUS | Status: AC
  Filled 2014-07-04: qty 100

## 2014-07-04 MED ORDER — OXYCODONE-ACETAMINOPHEN 5-325 MG PO TABS
ORAL_TABLET | ORAL | Status: AC
  Administered 2014-07-04: 1 via ORAL
  Filled 2014-07-04: qty 1

## 2014-07-04 MED ORDER — GLYCOPYRROLATE 0.2 MG/ML IJ SOLN
INTRAMUSCULAR | Status: DC | PRN
  Administered 2014-07-04: .2 mg via INTRAVENOUS
  Administered 2014-07-04: .6 mg via INTRAVENOUS

## 2014-07-04 MED ORDER — THROMBIN 5000 UNITS EX SOLR
OROMUCOSAL | Status: DC | PRN
  Administered 2014-07-04: 5 mL via TOPICAL

## 2014-07-04 MED ORDER — PROPOFOL 10 MG/ML IV BOLUS
INTRAVENOUS | Status: DC | PRN
  Administered 2014-07-04: 120 mg via INTRAVENOUS

## 2014-07-04 MED ORDER — ACETAMINOPHEN 10 MG/ML IV SOLN
INTRAVENOUS | Status: DC | PRN
  Administered 2014-07-04: 1000 mg via INTRAVENOUS

## 2014-07-04 MED ORDER — MIDAZOLAM HCL 5 MG/5ML IJ SOLN
INTRAMUSCULAR | Status: AC
  Administered 2014-07-04: 1 mg via INTRAVENOUS
  Filled 2014-07-04: qty 5

## 2014-07-04 SURGICAL SUPPLY — 45 items
ANCHOR TIS RET SYS 235ML (MISCELLANEOUS) ×3 IMPLANT
APPLICATOR SURGIFLO (MISCELLANEOUS) ×3 IMPLANT
APPLIER CLIP LOGIC TI 5 (MISCELLANEOUS) ×6 IMPLANT
BENZOIN TINCTURE PRP APPL 2/3 (GAUZE/BANDAGES/DRESSINGS) ×3 IMPLANT
BLADE CLIPPER SURG (BLADE) ×3 IMPLANT
BLADE SURG 11 STRL SS SAFETY (MISCELLANEOUS) ×3 IMPLANT
CANISTER SUCT 1200ML W/VALVE (MISCELLANEOUS) ×3 IMPLANT
CANNULA DILATOR 10 W/SLV (CANNULA) ×2 IMPLANT
CANNULA DILATOR 10MM W/SLV (CANNULA) ×1
CATH CHOLANG 76X19 KUMAR (CATHETERS) ×3 IMPLANT
CATH REDDICK CHOLANGI 4FR 50CM (CATHETERS) ×3 IMPLANT
CHLORAPREP W/TINT 26ML (MISCELLANEOUS) ×3 IMPLANT
CLOSURE WOUND 1/2 X4 (GAUZE/BANDAGES/DRESSINGS) ×1
DECANTER SPIKE VIAL GLASS SM (MISCELLANEOUS) ×3 IMPLANT
DEFOGGER SCOPE WARMER CLEARIFY (MISCELLANEOUS) ×3 IMPLANT
DRAPE C-ARM XRAY 36X54 (DRAPES) ×3 IMPLANT
DRAPE INCISE IOBAN 66X45 STRL (DRAPES) ×3 IMPLANT
DRESSING TELFA 4X3 1S ST N-ADH (GAUZE/BANDAGES/DRESSINGS) ×3 IMPLANT
DRSG TEGADERM 2X2.25 PEDS (GAUZE/BANDAGES/DRESSINGS) ×3 IMPLANT
GLOVE BIO SURGEON STRL SZ7 (GLOVE) ×15 IMPLANT
GOWN STRL REUS W/ TWL LRG LVL3 (GOWN DISPOSABLE) ×3 IMPLANT
GOWN STRL REUS W/TWL LRG LVL3 (GOWN DISPOSABLE) ×6
GRASPER SUT TROCAR 14GX15 (MISCELLANEOUS) ×3 IMPLANT
HEMOSTAT SURGICEL 2X3 (HEMOSTASIS) IMPLANT
IRRIGATION STRYKERFLOW (MISCELLANEOUS) ×1 IMPLANT
IRRIGATOR STRYKERFLOW (MISCELLANEOUS) ×3
IV LACTATED RINGERS 1000ML (IV SOLUTION) ×3 IMPLANT
KIT RM TURNOVER STRD PROC AR (KITS) ×3 IMPLANT
LABEL OR SOLS (LABEL) ×3 IMPLANT
NDL INSUFF ACCESS 14 VERSASTEP (NEEDLE) ×3 IMPLANT
NDL SAFETY ECLIPSE 18X1.5 (NEEDLE) ×1 IMPLANT
NEEDLE HYPO 18GX1.5 SHARP (NEEDLE) ×2
PACK LAP CHOLECYSTECTOMY (MISCELLANEOUS) ×3 IMPLANT
PAD GROUND ADULT SPLIT (MISCELLANEOUS) ×3 IMPLANT
SCISSORS METZENBAUM CVD 33 (INSTRUMENTS) ×3 IMPLANT
SLEEVE ENDOPATH XCEL 5M (ENDOMECHANICALS) ×6 IMPLANT
SPOGE SURGIFLO 8M (HEMOSTASIS) ×2
SPONGE SURGIFLO 8M (HEMOSTASIS) ×1 IMPLANT
STRIP CLOSURE SKIN 1/2X4 (GAUZE/BANDAGES/DRESSINGS) ×2 IMPLANT
SUT VIC AB 0 SH 27 (SUTURE) ×3 IMPLANT
SUT VIC AB 4-0 FS2 27 (SUTURE) ×3 IMPLANT
SWABSTK COMLB BENZOIN TINCTURE (MISCELLANEOUS) ×3 IMPLANT
SYR 3ML LL SCALE MARK (SYRINGE) ×3 IMPLANT
TROCAR XCEL NON-BLD 5MMX100MML (ENDOMECHANICALS) ×3 IMPLANT
TUBING INSUFFLATOR HI FLOW (MISCELLANEOUS) ×3 IMPLANT

## 2014-07-04 NOTE — Progress Notes (Signed)
Patient is continuously moving in the bed and screaming loudly. This nurse has addressed his pain and received and additional order to give the patient 1mg  of Versed. The patient responds by sleeping intermittently and then wakes up trying to get off of the bed.

## 2014-07-04 NOTE — ED Notes (Signed)
Had gallbladder out today and has not voided since 1300

## 2014-07-04 NOTE — Discharge Instructions (Addendum)
AMBULATORY SURGERY  DISCHARGE INSTRUCTIONS   1) The drugs that you were given will stay in your system until tomorrow so for the next 24 hours you should not:  A) Drive an automobile B) Make any legal decisions C) Drink any alcoholic beverage   2) You may resume regular meals tomorrow.  Today it is better to start with liquids and gradually work up to solid foods.  You may eat anything you prefer, but it is better to start with liquids, then soup and crackers, and gradually work up to solid foods.   3) Please notify your doctor immediately if you have any unusual bleeding, trouble breathing, redness and pain at the surgery site, drainage, fever, or pain not relieved by medication.                 Please call to schedule your post-operative visit.  4) Additional Instructions: 5)

## 2014-07-04 NOTE — Progress Notes (Signed)
Patient has started to calm down. He is sleeping more frequently now and is not moaning as loudly. He is opening his eyes and is able to acknowledge this nurse with eye contact.

## 2014-07-04 NOTE — Op Note (Signed)
Preop diagnosis: Cholelithiasis and chronic cholecystitis  Post op diagnosis: Same  Operation: Laparoscopy cholecystectomy with intraoperative cholangiogram  Anesthesia: Gen.  Complications: None  EBL: 50 mL  Drains: None  Patient was brought to the operating room placed in the supine position the operating table. He was put to sleep with endotracheal tube. The abdomen was prepped and draped sterile field. Timeout was performed. Port site incision was made in H above the umbilicus and a Veress needle introduced the peritoneal cavity and verified of the hanging drop method. Pneumoperitoneum was obtained 11 mm port was then placed. Camera in place epigastric and 2 lateral 5 mm ports were placed. The gallbladder was noted to be somewhat and walled but with some adhesions. Wth adequate traction and exposure using an angled scope the adhesions were taken down and the Hartmann's pouch was exposed. Dissection was carried out until the cystic duct and cystic artery were identified. Cystic artery was hemoclipped and cut. The cystic duct was proximally hemoclipped and a small opening made through which a Reddick catheter was  placed. Cholangiogram was performed showing a normal-appearing duct and proximal radicals did not fill completely but the common hepatic and common bile duct appeared normal. The catheter was removed. The cystic duct was hemoclipped and cut ,a small cyst posterior branch was noted to be bleeding some which required additional control of these clip. The gallbladder was then dissected free from its bed using cautery for control of bleeding the minimal oozing from the gallbladder bed was noted. Accordingly this area was irrigation and suctioned out to ensure there was no significant bleeding points the bed was covered with 5 mL of Surgi-Flo containing thrombin. With a 5 mm scope in the epigastric port site the gallbladder was placed in the retrieval bag and brought out through the umbilical  port site. Was noted to contain multiple stones. The fascial opening at the supraumbilical port side was closed with 0 Vicryl placed with a suture passer. Pneumoperitoneum was released after all the fluid was suctioned out from the upper quadrant. Ports removed. Skin incisions closed with subcuticular 4-0 Vicryl reinforced with Steri-Strips and tincture benzoin and dry sterile dressing was placed. Patient subsequently was extubated and returned recovery room in stable condition.   disc c

## 2014-07-04 NOTE — Interval H&P Note (Signed)
History and Physical Interval Note:  07/04/2014 7:08 AM  Andre Edwards  has presented today for surgery, with the diagnosis of cholelithiesis  The various methods of treatment have been discussed with the patient and family. After consideration of risks, benefits and other options for treatment, the patient has consented to  Procedure(s): LAPAROSCOPIC CHOLECYSTECTOMY (N/A) as a surgical intervention .  The patient's history has been reviewed, patient examined, no change in status, stable for surgery.  I have reviewed the patient's chart and labs.  Questions were answered to the patient's satisfaction.     Chancellor Vanderloop G

## 2014-07-04 NOTE — ED Notes (Signed)
Pt reports gall bladder surgery this morning, hasn't urinated since 1pm.  Pt reports intense pressure in bladder.  Pt distended, firm upon palpation, and tender.  Catheter placed per dr. Dahlia Client verbal order.

## 2014-07-04 NOTE — H&P (View-Only) (Signed)
Patient ID: Andre Edwards, male   DOB: 09/04/1945, 70 y.o.   MRN: [id number]  Chief Complaint  Patient presents with  . Other    Eval Gallstones    HPI Andre Edwards is a 69 y.o. male here for evaluation of abdominal pain and gallstones. He first noticed the pain in his right shoulder blade and under his right ribs. He states that this pain would come and go and at times it would hurt all day. He states that the pain has gotten better. Some days he does not have any problems. He reports no problems with using the bathroom. He reports no abdominal bloating. He was seen by his PCP for this and had an ultrasound done on 05/27/14 showing multiple mobile gallstones.  HPI  Past Medical History  Diagnosis Date  . Cataract   . Diabetes mellitus without complication   . Pneumonia 02/2013  . Enlarged prostate   . Hypertension   . Hyperlipidemia     Past Surgical History  Procedure Laterality Date  . Eye surgery Bilateral 2015    History reviewed. No pertinent family history.  Social History History  Substance Use Topics  . Smoking status: Never Smoker   . Smokeless tobacco: Current User  . Alcohol Use: No    Allergies  Allergen Reactions  . Erythromycin Itching    Current Outpatient Prescriptions  Medication Sig Dispense Refill  . aspirin 81 MG chewable tablet Chew 81 mg by mouth daily.    Marland Kitchen atorvastatin (LIPITOR) 10 MG tablet Take 10 mg by mouth daily.    . canagliflozin (INVOKANA) 300 MG TABS tablet Take by mouth.    . cetirizine (ZYRTEC) 10 MG tablet Take 10 mg by mouth daily.    . Chromium-Cinnamon 50-500 MCG-MG CAPS Take 500 mg/kg/day by mouth.    . diltiazem (CARDIZEM LA) 120 MG 24 hr tablet Take 240 mg by mouth daily.    Marland Kitchen docusate sodium (COLACE) 100 MG capsule Take 100 mg by mouth 2 (two) times daily.    Marland Kitchen glipiZIDE (GLUCOTROL) 10 MG tablet Take 10 mg by mouth daily before breakfast.    . hydrochlorothiazide (HYDRODIURIL) 25 MG tablet Take 25 mg by mouth daily.    .  metFORMIN (GLUCOPHAGE) 1000 MG tablet Take 1,000 mg by mouth 2 (two) times daily with a meal.    . phenylephrine (SUDAFED PE) 10 MG TABS tablet Take 10 mg by mouth every 4 (four) hours as needed.    . tamsulosin (FLOMAX) 0.4 MG CAPS capsule Take 0.4 mg by mouth.    . vitamin B-12 (CYANOCOBALAMIN) 1000 MCG tablet Take 1,000 mcg by mouth daily.     No current facility-administered medications for this visit.    Review of Systems Review of Systems  Constitutional: Negative.   Respiratory: Negative.   Cardiovascular: Negative.   Gastrointestinal: Positive for abdominal pain (under right ribs). Negative for nausea, vomiting, diarrhea, constipation, blood in stool, abdominal distention, anal bleeding and rectal pain.    Blood pressure 122/84, pulse 80, resp. rate 14, height 5\' 10"  (1.778 m), weight 228 lb (103.42 kg).  Physical Exam Physical Exam  Constitutional: He is oriented to person, place, and time. He appears well-developed and well-nourished.  Eyes: Conjunctivae are normal. No scleral icterus.  Neck: Neck supple.  Cardiovascular: Normal rate, regular rhythm and normal heart sounds.   Pulmonary/Chest: Effort normal and breath sounds normal.  Abdominal: Soft. Normal appearance. There is no hepatomegaly. There is no tenderness. There is negative Murphy's  sign.    Lymphadenopathy:    He has no cervical adenopathy.  Neurological: He is alert and oriented to person, place, and time.  Skin: Skin is warm and dry.    Data Reviewed LFTs were normal. Ultrasound showing multiple gallstones.  Assessment    Symptomatic cholelithiasis      Plan    Laparoscopy cholecystectomy  Laparoscopic Cholecystectomy with Intraoperative Cholangiogram. The procedure, including it's potential risks and complications (including but not limited to infection, bleeding, injury to intra-abdominal organs or bile ducts, bile leak, poor cosmetic result, sepsis and death) were discussed with the patient in  detail. Non-operative options, including their inherent risks (acute calculous cholecystitis with possible choledocholithiasis or gallstone pancreatitis, with the risk of ascending cholangitis, sepsis, and death) were discussed as well. The patient expressed and understanding of what we discussed and wishes to proceed with laparoscopic cholecystectomy. The patient further understands that if it is technically not possible, or it is unsafe to proceed laparoscopically, that I will convert to an open cholecystectomy.     Patient's surgery has been scheduled for 07-12-14 [facility]. It is okay for patient to continue 81 mg aspirin once daily.  Ref/PCP: Dr Octavio Manns 06/13/2014, 1:15 PM

## 2014-07-04 NOTE — Anesthesia Preprocedure Evaluation (Signed)
Anesthesia Evaluation  Patient identified by MRN, date of birth, ID band Patient awake    Reviewed: Allergy & Precautions, NPO status , Patient's Chart, lab work & pertinent test results  Airway Mallampati: III  TM Distance: >3 FB Neck ROM: Limited  Mouth opening: Limited Mouth Opening  Dental  (+) Teeth Intact   Pulmonary sleep apnea ,    Pulmonary exam normal       Cardiovascular Exercise Tolerance: Poor hypertension, Pt. on medications Normal cardiovascular exam    Neuro/Psych    GI/Hepatic   Endo/Other  diabetes, Type 2BG 137 this morning.  Renal/GU      Musculoskeletal   Abdominal Normal abdominal exam  (+)   Peds  Hematology   Anesthesia Other Findings   Reproductive/Obstetrics                             Anesthesia Physical Anesthesia Plan  ASA: III  Anesthesia Plan: General   Post-op Pain Management:    Induction: Intravenous  Airway Management Planned: Oral ETT  Additional Equipment:   Intra-op Plan:   Post-operative Plan: Extubation in OR  Informed Consent: I have reviewed the patients History and Physical, chart, labs and discussed the procedure including the risks, benefits and alternatives for the proposed anesthesia with the patient or authorized representative who has indicated his/her understanding and acceptance.     Plan Discussed with: CRNA  Anesthesia Plan Comments:         Anesthesia Quick Evaluation

## 2014-07-04 NOTE — Transfer of Care (Signed)
Immediate Anesthesia Transfer of Care Note  Patient: Andre Edwards  Procedure(s) Performed: Procedure(s): LAPAROSCOPIC CHOLECYSTECTOMY (N/A)  Patient Location: PACU  Anesthesia Type:General  Level of Consciousness: awake, sedated and pateint uncooperative  Airway & Oxygen Therapy: Patient Spontanous Breathing and Patient connected to face mask oxygen  Post-op Assessment: Report given to RN and Post -op Vital signs reviewed and stable  Post vital signs: Reviewed and stable  Last Vitals:  Filed Vitals:   07/04/14 0632  BP: 124/71  Pulse: 70  Temp: 37 C  Resp: 14    Complications: No apparent anesthesia complications

## 2014-07-04 NOTE — Anesthesia Postprocedure Evaluation (Signed)
  Anesthesia Post-op Note  Patient: Andre Edwards  Procedure(s) Performed: Procedure(s): LAPAROSCOPIC CHOLECYSTECTOMY (N/A)  Anesthesia type:General  Patient location: PACU  Post pain: Pain level controlled  Post assessment: Post-op Vital signs reviewed, Patient's Cardiovascular Status Stable, Respiratory Function Stable, Patent Airway and No signs of Nausea or vomiting  Post vital signs: Reviewed and stable  Last Vitals:  Filed Vitals:   07/04/14 1020  BP: 126/80  Pulse: 66  Temp:   Resp: 15    Level of consciousness: awake, alert  and patient cooperative  Complications: No apparent anesthesia complications

## 2014-07-05 ENCOUNTER — Emergency Department: Payer: BLUE CROSS/BLUE SHIELD

## 2014-07-05 ENCOUNTER — Encounter: Payer: Self-pay | Admitting: General Surgery

## 2014-07-05 ENCOUNTER — Telehealth: Payer: Self-pay | Admitting: *Deleted

## 2014-07-05 LAB — URINALYSIS COMPLETE WITH MICROSCOPIC (ARMC ONLY)
Bacteria, UA: NONE SEEN
Bilirubin Urine: NEGATIVE
Glucose, UA: >500 mg/dL — AB
LEUKOCYTES UA: NEGATIVE
Nitrite: NEGATIVE
PROTEIN: NEGATIVE mg/dL
SPECIFIC GRAVITY, URINE: 1.03 (ref 1.005–1.030)
Squamous Epithelial / LPF: NONE SEEN
pH: 5 (ref 5.0–8.0)

## 2014-07-05 LAB — BASIC METABOLIC PANEL
ANION GAP: 6 (ref 5–15)
BUN: 15 mg/dL (ref 6–20)
CHLORIDE: 105 mmol/L (ref 101–111)
CO2: 27 mmol/L (ref 22–32)
CREATININE: 1.12 mg/dL (ref 0.61–1.24)
Calcium: 8.8 mg/dL — ABNORMAL LOW (ref 8.9–10.3)
GFR calc Af Amer: >60 mL/min (ref >60)
GFR calc non Af Amer: >60 mL/min (ref >60)
GLUCOSE: 170 mg/dL — AB (ref 65–99)
Potassium: 4.1 mmol/L (ref 3.5–5.1)
Sodium: 138 mmol/L (ref 135–145)

## 2014-07-05 MED ORDER — LEVOFLOXACIN 750 MG PO TABS
ORAL_TABLET | ORAL | Status: AC
  Filled 2014-07-05: qty 1

## 2014-07-05 MED ORDER — LEVOFLOXACIN 750 MG PO TABS
750.0000 mg | ORAL_TABLET | Freq: Once | ORAL | Status: AC
  Administered 2014-07-05: 750 mg via ORAL

## 2014-07-05 MED ORDER — LEVOFLOXACIN 750 MG PO TABS: 750.0000 mg | ORAL_TABLET | Freq: Every day | ORAL | Status: AC

## 2014-07-05 NOTE — Telephone Encounter (Signed)
Wife called to say the Neale had trouble voiding postoperatively. He ended up going to the ED and had a foley placed. His appointment it Dr Elnoria Howard is for Friday. How long is the catheter suppose to stay in?

## 2014-07-05 NOTE — ED Provider Notes (Signed)
[facility] Emergency Department Provider Note  ____________________________________________  Time seen: Approximately 12:19 AM  I have reviewed the triage vital signs and the nursing notes.   HISTORY  Chief Complaint Urinary Retention    HPI Andre Edwards is a 69 y.o. male who had his gallbladder taken out this morning. He reports that he arrived home at approximately 1300 and has been unable to urinate since then. He reports that he did have a small amount of urination at 1300 but has not been able to do so since then. He reports his never had this problem before and is unsure of the cause. The patient has had an enlarged prostate in the past but did have surgery in the past. The patient reports that he did have some lower abdominal pain from the swelling of being unable to urinate. The patient took one pill of his pain medication today and this was a follow-up with Dr. Jamal Collin on June 7.   Past Medical History  Diagnosis Date  . Cataract   . Diabetes mellitus without complication   . Pneumonia 02/2013  . Enlarged prostate   . Hypertension   . Hyperlipidemia   . Sleep apnea     Patient Active Problem List   Diagnosis Date Noted  . Benign fibroma of prostate 06/29/2014  . Burn 06/29/2014  . Diabetes mellitus, type 2 06/29/2014  . Abnormal prostate specific antigen 06/29/2014  . Allergy to environmental factors 06/29/2014  . HLD (hyperlipidemia) 06/29/2014  . BP (high blood pressure) 06/29/2014  . Stasis, venous 06/29/2014    Past Surgical History  Procedure Laterality Date  . Eye surgery Bilateral 2015  . Prostate surgery N/A   . Cholecystectomy      Current Outpatient Rx  Name  Route  Sig  Dispense  Refill  . aspirin 81 MG chewable tablet   Oral   Chew 81 mg by mouth daily.         Marland Kitchen atorvastatin (LIPITOR) 10 MG tablet   Oral   Take 10 mg by mouth daily.         . canagliflozin (INVOKANA) 300 MG TABS tablet   Oral   Take 300 mg  by mouth daily after breakfast.          . cetirizine (ZYRTEC) 10 MG tablet   Oral   Take 10 mg by mouth daily.         . Chromium-Cinnamon 50-500 MCG-MG CAPS   Oral   Take 500 mg/kg/day by mouth daily.          Marland Kitchen diltiazem (CARDIZEM LA) 120 MG 24 hr tablet   Oral   Take 240 mg by mouth daily.         Marland Kitchen docusate sodium (COLACE) 100 MG capsule   Oral   Take 100 mg by mouth daily.          Marland Kitchen glipiZIDE (GLUCOTROL) 10 MG tablet   Oral   Take 10 mg by mouth daily before breakfast.         . hydrochlorothiazide (HYDRODIURIL) 25 MG tablet   Oral   Take 25 mg by mouth daily.         Marland Kitchen levofloxacin (LEVAQUIN) 750 MG tablet   Oral   Take 1 tablet (750 mg total) by mouth daily.   7 tablet   0   . metFORMIN (GLUCOPHAGE) 1000 MG tablet   Oral   Take 1,000 mg by mouth 2 (two) times daily with  a meal.         . oxyCODONE-acetaminophen (ROXICET) 5-325 MG per tablet   Oral   Take 1 tablet by mouth every 4 (four) hours as needed for moderate pain or severe pain.   30 tablet   0   . phenylephrine (SUDAFED PE) 10 MG TABS tablet   Oral   Take 10 mg by mouth every 4 (four) hours as needed.         . tamsulosin (FLOMAX) 0.4 MG CAPS capsule   Oral   Take 0.4 mg by mouth daily.          . vitamin B-12 (CYANOCOBALAMIN) 1000 MCG tablet   Oral   Take 1,000 mcg by mouth daily.           Allergies Erythromycin  History reviewed. No pertinent family history.  Social History History  Substance Use Topics  . Smoking status: Never Smoker   . Smokeless tobacco: Current User    Types: Chew  . Alcohol Use: No    Review of Systems Constitutional: No fever/chills Eyes: No visual changes. ENT: No sore throat. Cardiovascular: Denies chest pain. Respiratory: Denies shortness of breath. Gastrointestinal: Abdominal pain Genitourinary: Urinary retention. Musculoskeletal: Negative for back pain. Skin: Negative for rash. Neurological: Negative for headaches,  focal weakness or numbness.  10-point ROS otherwise negative.  ____________________________________________   PHYSICAL EXAM:  VITAL SIGNS: ED Triage Vitals  Enc Vitals Group     BP 07/04/14 2315 146/101 mmHg     Pulse Rate 07/04/14 2313 102     Resp 07/04/14 2313 18     Temp 07/04/14 2313 98.3 F (36.8 C)     Temp Source 07/04/14 2313 Oral     SpO2 07/04/14 2313 98 %     Weight 07/04/14 2313 220 lb (99.791 kg)     Height 07/04/14 2313 5\' 9"  (1.753 m)     Head Cir --      Peak Flow --      Pain Score 07/04/14 2314 10     Pain Loc --      Pain Edu? --      Excl. in [place]? --     Constitutional: Alert and oriented. Well appearing and in no acute distress. Eyes: Conjunctivae are normal. PERRL. EOMI. Head: Atraumatic. Nose: No congestion/rhinnorhea. Mouth/Throat: Mucous membranes are moist.  Oropharynx non-erythematous. Cardiovascular: Normal rate, regular rhythm. Grossly normal heart sounds.  Good peripheral circulation. Respiratory: Normal respiratory effort.  No retractions. Lungs CTAB. Gastrointestinal: Soft and nontender. No distention. No abdominal bruits. No CVA tenderness. Genitourinary: foley in place Musculoskeletal: No lower extremity tenderness nor edema.   Neurologic:  Normal speech and language. No gross focal neurologic deficits are appreciated.  Skin:  Skin is warm, dry and intact. No rash noted. Psychiatric: Mood and affect are normal.  ____________________________________________   LABS (all labs ordered are listed, but only abnormal results are displayed)  Labs Reviewed  URINALYSIS COMPLETEWITH MICROSCOPIC ([place])  - Abnormal; Notable for the following:    Color, Urine YELLOW (*)    APPearance CLEAR (*)    Glucose, UA >500 (*)    Ketones, ur TRACE (*)    Hgb urine dipstick 2+ (*)    All other components within normal limits  BASIC METABOLIC PANEL - Abnormal; Notable for the following:    Glucose, Bld 170 (*)    Calcium 8.8 (*)    All other  components within normal limits   ____________________________________________  EKG  None ____________________________________________  RADIOLOGY  Chest x-ray: Small right basilar opacity indeterminate but likely atelectasis or aspiration. ____________________________________________   PROCEDURES  Procedure(s) performed: None  Critical Care performed: No  ____________________________________________   INITIAL IMPRESSION / ASSESSMENT AND PLAN / ED COURSE  Pertinent labs & imaging results that were available during my care of the patient were reviewed by me and considered in my medical decision making (see chart for details).  This is 69 year old male who comes in today after having a cholecystectomy with urinary retention. Upon initial evaluation patient had a Foley catheter placed and over a liter drained from his bladder. I will check the patient's urinalysis for possible infection as well as the patient's BMP for his kidney function. The patient has had some hypoxia with sleeping so I will also do a chest x-ray to evaluate for possible atelectasis versus pneumonia.   Patient will be sent home with a leg bag to follow-up with urology. I will also give him Levaquin to treat the possible aspiration on chest x-ray. Patient also reports that he does have sleep apnea which may be the cause for his episodes of hypoxia while sleeping.  ____________________________________________   FINAL CLINICAL IMPRESSION(S) / ED DIAGNOSES  Final diagnoses:  Urinary retention  Atelectasis      Loney Hering, MD 07/05/14 [phone]

## 2014-07-05 NOTE — Discharge Instructions (Signed)
Acute Urinary Retention Acute urinary retention is the temporary inability to urinate. This is a common problem in older men. As men age their prostates become larger and block the flow of urine from the bladder. This is usually a problem that has come on gradually.  HOME CARE INSTRUCTIONS If you are sent home with a Foley catheter and a drainage system, you will need to discuss the best course of action with your health care provider. While the catheter is in, maintain a good intake of fluids. Keep the drainage bag emptied and lower than your catheter. This is so that contaminated urine will not flow back into your bladder, which could lead to a urinary tract infection. There are two main types of drainage bags. One is a large bag that usually is used at night. It has a good capacity that will allow you to sleep through the night without having to empty it. The second type is called a leg bag. It has a smaller capacity, so it needs to be emptied more frequently. However, the main advantage is that it can be attached by a leg strap and can go underneath your clothing, allowing you the freedom to move about or leave your home. Only take over-the-counter or prescription medicines for pain, discomfort, or fever as directed by your health care provider.  SEEK MEDICAL CARE IF:  You develop a low-grade fever.  You experience spasms or leakage of urine with the spasms. SEEK IMMEDIATE MEDICAL CARE IF:   You develop chills or fever.  Your catheter stops draining urine.  Your catheter falls out.  You start to develop increased bleeding that does not respond to rest and increased fluid intake. MAKE SURE YOU:  Understand these instructions.  Will watch your condition.  Will get help right away if you are not doing well or get worse. Document Released: 05/06/2000 Document Revised: 02/02/2013 Document Reviewed: 07/09/2012 [facility] Patient Information 2015 [place], [place]. This information is not  intended to replace advice given to you by your health care provider. Make sure you discuss any questions you have with your health care provider.  Atelectasis Atelectasis is a collapse of the small air sacs in the lungs (alveoli). When this occurs, all or part of a lung collapses and becomes airless. It can be caused by various things and is a common problem after surgery. The severity of atelectasis will vary depending on the size of the area involved and the underlying cause of the condition. CAUSES  There are multiple causes for atelectasis:   Shallow breathing, particularly if there is an injury to your chest wall or abdomen that makes it painful to take a deep breath. This commonly occurs after surgery.  Obstruction of your airways (bronchi or bronchioles). This may be caused by a buildup of mucus (mucus plug), tumors, blood clots (pulmonary embolus), or inhaled foreign bodies. Mucus plugs occur when the lungs do not expand enough to get rid of mucus.  Outside pressure on the lung. This may be caused by tumors, fluid (pleural effusion), or a leakage of air between the lung and rib cage (pneumothorax).   Infections such as pneumonia.  Scarring in lung tissue left over from previous infection or injury.  Some diseases such as cystic fibrosis. SIGNS AND SYMPTOMS  Often, atelectasis will have no symptoms. When symptoms occur, they include:  Shortness of breath.   Bluish color to your nails, lips, or mouth (cyanosis). DIAGNOSIS  Your health care provider may suspect atelectasis based on symptoms  and physical findings. A chest X-ray may be done to confirm the diagnosis. More specialized X-ray exams are sometimes required.  TREATMENT  Treatment will depend on the cause of the atelectasis. Treatment may include:  Purposeful coughing to loosen mucus plugs in the lungs.  Chest physiotherapy. This consists of clapping or percussion on the chest over the lungs to further loosen mucus  plugs.  Postural drainage techniques. This involves positioning your body so your head is lower than your chest. [place] relaxed deep breathing whenever you are sitting down. A good technique is to take a few relaxed deep breaths each time a commercial comes on if you are watching television.  If you were given a deep breathing device (such as an incentive spirometer) or a mucus clearance device, use this regularly as directed by your health care provider.  Try to cough several times a day as directed by your health care provider.  Perform any chest physiotherapy or postural drainage techniques as directed by your health care provider. If necessary, have someone (such as a family member) assist you with these techniques.  When lying down, lie on the unaffected side to encourage mucus drainage.  Stay physically active as much as possible. SEEK IMMEDIATE MEDICAL CARE IF:   You develop increasing problems with your breathing.   You develop severe chest pain.   You develop severe coughing, or you cough up blood.   You have a fever or persistent symptoms for more than 2-3 days.   You have a fever and your symptoms suddenly get worse.  MAKE SURE YOU:  Understand these instructions.  Will watch your condition.  Will get help right away if you are not doing well or get worse. Document Released: 01/28/2005 Document Revised: 02/02/2013 Document Reviewed: 08/05/2012 [facility] Patient Information 2015 [place], [place]. This information is not intended to replace advice given to you by your health care provider. Make sure you discuss any questions you have with your health care provider.

## 2014-07-05 NOTE — Telephone Encounter (Signed)
Recommend leaving foley in place until his appointment with Dr Elnoria Howard Friday.

## 2014-07-07 LAB — SURGICAL PATHOLOGY

## 2014-07-19 ENCOUNTER — Ambulatory Visit: Payer: Medicare Other | Admitting: General Surgery

## 2014-07-25 ENCOUNTER — Encounter: Payer: Self-pay | Admitting: General Surgery

## 2014-07-25 ENCOUNTER — Ambulatory Visit (INDEPENDENT_AMBULATORY_CARE_PROVIDER_SITE_OTHER): Payer: BLUE CROSS/BLUE SHIELD | Admitting: General Surgery

## 2014-07-25 VITALS — BP 128/76 | HR 72 | Resp 12 | Ht 69.0 in | Wt 224.0 lb

## 2014-07-25 DIAGNOSIS — K8010 Calculus of gallbladder with chronic cholecystitis without obstruction: Secondary | ICD-10-CM

## 2014-07-25 NOTE — Progress Notes (Signed)
Patient here to follow up from laparoscopy cholecystectomy done on 07/04/14. Patient states he is doing well, he did notice some knots near incision site and some back pain. Patient is eating and moving bowels regularly.   Abdomen soft with normoactive bowel sounds. Port sites healing well. No scleral icterus noted.   No activity or diet restrictions. Can return to work.   Follow up: as needed

## 2014-07-25 NOTE — Patient Instructions (Signed)
No activity or diet restrictions. Can return to work.  Follow up: as needed

## 2014-07-26 ENCOUNTER — Ambulatory Visit (INDEPENDENT_AMBULATORY_CARE_PROVIDER_SITE_OTHER): Payer: BLUE CROSS/BLUE SHIELD | Admitting: Urology

## 2014-07-26 ENCOUNTER — Encounter: Payer: Self-pay | Admitting: Urology

## 2014-07-26 VITALS — BP 119/75 | HR 80 | Ht 69.0 in | Wt 198.1 lb

## 2014-07-26 DIAGNOSIS — R339 Retention of urine, unspecified: Secondary | ICD-10-CM

## 2014-07-26 DIAGNOSIS — R972 Elevated prostate specific antigen [PSA]: Secondary | ICD-10-CM

## 2014-07-26 DIAGNOSIS — N400 Enlarged prostate without lower urinary tract symptoms: Secondary | ICD-10-CM | POA: Diagnosis not present

## 2014-07-26 LAB — URINALYSIS, COMPLETE
BILIRUBIN UA: NEGATIVE
Ketones, UA: NEGATIVE
Leukocytes, UA: NEGATIVE
Nitrite, UA: NEGATIVE
PH UA: 5.5 (ref 5.0–7.5)
Protein, UA: NEGATIVE
RBC, UA: NEGATIVE
Specific Gravity, UA: 1.015 (ref 1.005–1.030)
UUROB: 0.2 mg/dL (ref 0.2–1.0)

## 2014-07-26 LAB — MICROSCOPIC EXAMINATION: Bacteria, UA: NONE SEEN

## 2014-07-26 LAB — BLADDER SCAN AMB NON-IMAGING

## 2014-07-26 MED ORDER — TAMSULOSIN HCL 0.4 MG PO CAPS: 0.4000 mg | ORAL_CAPSULE | Freq: Every day | ORAL | Status: DC

## 2014-07-26 NOTE — Progress Notes (Signed)
07/26/2014 10:18 AM   Andre Edwards [birth year] [id number]  Referring provider: Idelle Crouch, MD [place] [facility] [address], [place] [address]  Chief Complaint  Patient presents with  . Benign Prostatic Hypertrophy    35month follow up    HPI:   1. 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 Now manages on daily tamsulosin w/o bother. He is on sudafed as well for allergies.   2. Elevated PSA - long h/o PSA around 4. Negative BX 2013.  07/2014 - PSA pending, now catheter free  3. Urinary retention - new retention after lap chole 2016. Passed office trial of void subsequently. PVR 2016 "182mL"  Today " Andre Edwards" is seen in f/u above. No interval retention of gross hematuria.    PMH: Past Medical History  Diagnosis Date  . Cataract   . Diabetes mellitus without complication   . Pneumonia 02/2013  . Enlarged prostate   . Hypertension   . Hyperlipidemia   . Sleep apnea     Surgical History: Past Surgical History  Procedure Laterality Date  . Eye surgery Bilateral 2015  . Prostate surgery N/A   . Cholecystectomy    . Cholecystectomy N/A 07/04/2014    Procedure: LAPAROSCOPIC CHOLECYSTECTOMY;  Surgeon: Christene Lye, MD;  Location: [facility] ORS;  Service: General;  Laterality: N/A;    Home Medications:    Medication List       This list is accurate as of: 07/26/14 10:18 AM.  Always use your most recent med list.               aspirin 81 MG chewable tablet  Chew 81 mg by mouth daily.     atorvastatin 10 MG tablet  Commonly known as:  LIPITOR  Take 10 mg by mouth daily.     canagliflozin 300 MG Tabs tablet  Commonly known as:  INVOKANA  Take 300 mg by mouth daily after breakfast.     cetirizine 10 MG tablet  Commonly known as:  ZYRTEC  Take 10 mg by mouth daily.     Chromium-Cinnamon 50-500 MCG-MG Caps  Take 500 mg/kg/day by mouth daily.     diltiazem 120 MG 24 hr tablet    Commonly known as:  CARDIZEM LA  Take 240 mg by mouth daily.     docusate sodium 100 MG capsule  Commonly known as:  COLACE  Take 100 mg by mouth daily.     glipiZIDE 10 MG tablet  Commonly known as:  GLUCOTROL  Take 10 mg by mouth daily before breakfast.     hydrochlorothiazide 25 MG tablet  Commonly known as:  HYDRODIURIL  Take 25 mg by mouth daily.     metFORMIN 1000 MG tablet  Commonly known as:  GLUCOPHAGE  Take 1,000 mg by mouth 2 (two) times daily with a meal.     oxyCODONE-acetaminophen 5-325 MG per tablet  Commonly known as:  ROXICET  Take 1 tablet by mouth every 4 (four) hours as needed for moderate pain or severe pain.     phenylephrine 10 MG Tabs tablet  Commonly known as:  SUDAFED PE  Take 10 mg by mouth every 4 (four) hours as needed.     tamsulosin 0.4 MG Caps capsule  Commonly known as:  FLOMAX  Take 0.4 mg by mouth daily.     vitamin B-12 1000 MCG tablet  Commonly known as:  CYANOCOBALAMIN  Take 1,000 mcg by mouth daily.        Allergies:  Allergies  Allergen Reactions  . Erythromycin Itching and Swelling    Family History: History reviewed. No pertinent family history.  Social History:  reports that he has never smoked. His smokeless tobacco use includes Chew. He reports that he does not drink alcohol or use illicit drugs.  ROS: Urological Symptom Review  Patient is experiencing the following symptoms: Frequent urination Get up at night to urinate   Review of Systems  Gastrointestinal (upper)  : Negative for upper GI symptoms after recetn lap chole  Gastrointestinal (lower) : Negative for lower GI symptoms  Constitutional : Negative for symptoms  Skin: Negative for skin symptoms  Eyes: Negative for eye symptoms  Ear/Nose/Throat : Negative for Ear/Nose/Throat symptoms  Hematologic/Lymphatic: Negative for Hematologic/Lymphatic symptoms  Cardiovascular : Negative for cardiovascular symptoms  Respiratory : Negative  for respiratory symptoms  Endocrine: Negative for endocrine symptoms  Musculoskeletal: Negative for musculoskeletal symptoms  Neurological: Negative for neurological symptoms  Psychologic: Negative for psychiatric symptoms   Physical Exam: BP 119/75 mmHg  Pulse 80  Ht 5\' 9"  (1.753 m)  Wt 198 lb 1.6 oz (89.858 kg)  BMI 29.24 kg/m2  Constitutional:  Alert and oriented, No acute distress. HEENT: [place] AT, moist mucus membranes.  Trachea midline, no masses. Cardiovascular: No clubbing, cyanosis, or edema. Respiratory: Normal respiratory effort, no increased work of breathing. GI: Abdomen is soft, nontender, nondistended, no abdominal masses GU: No CVA tenderness. Phallus striaght. DRE 60gm smooth. Skin: No rashes, bruises or suspicious lesions. Lymph: No cervical or inguinal adenopathy. Neurologic: Grossly intact, no focal deficits, moving all 4 extremities. Psychiatric: Normal mood and affect.  Laboratory Data: Lab Results  Component Value Date   WBC 6.1 06/20/2014   HGB 11.7* 06/20/2014   HCT 36.8* 06/20/2014   MCV 80.1 06/20/2014   PLT 237 06/20/2014    Lab Results  Component Value Date   CREATININE 1.12 07/05/2014    No results found for: PSA  No results found for: TESTOSTERONE  No results found for: HGBA1C  Urinalysis    Component Value Date/Time   COLORURINE YELLOW* 07/05/2014 0046   APPEARANCEUR CLEAR* 07/05/2014 0046   LABSPEC 1.030 07/05/2014 0046   PHURINE 5.0 07/05/2014 0046   GLUCOSEU >500* 07/05/2014 0046   HGBUR 2+* 07/05/2014 0046   BILIRUBINUR NEGATIVE 07/05/2014 0046   KETONESUR TRACE* 07/05/2014 0046   PROTEINUR NEGATIVE 07/05/2014 0046   NITRITE NEGATIVE 07/05/2014 0046   LEUKOCYTESUR NEGATIVE 07/05/2014 0046    Pertinent Imaging:  PVR 158mL  Assessment & Plan:   1. Benign fibroma of prostate - doing well on tamsulosin after KTP. Explained that sudafed may make this worse and antagonizes tamsulosin some, he wil try to cut back on  sudafed if possible.   2. Elevated PSA - Overall fairly stable with negative prior BX. PSA today and consider no furhter screening as long as not drasticlaly chnaged.   3. Urinary retention - only modest elevation of PVR today that is acceptable.   4 - RTC 1 year, then consider prn if stable.    Return in about 1 year (around 07/26/2015).  Alexis Frock, [facility] [address] [place], [place] [address] [phone]

## 2014-07-27 LAB — PSA: Prostate Specific Ag, Serum: 4.5 ng/mL — ABNORMAL HIGH (ref 0.0–4.0)

## 2014-08-25 ENCOUNTER — Ambulatory Visit: Payer: Self-pay

## 2014-10-09 ENCOUNTER — Emergency Department
Admission: EM | Admit: 2014-10-09 | Discharge: 2014-10-09 | Disposition: A | Discharge status: Home / Self Care | Payer: BLUE CROSS/BLUE SHIELD | Attending: Emergency Medicine | Admitting: Emergency Medicine

## 2014-10-09 ENCOUNTER — Encounter: Payer: Self-pay | Admitting: Emergency Medicine

## 2014-10-09 ENCOUNTER — Other Ambulatory Visit: Payer: Self-pay

## 2014-10-09 DIAGNOSIS — E119 Type 2 diabetes mellitus without complications: Secondary | ICD-10-CM | POA: Diagnosis not present

## 2014-10-09 DIAGNOSIS — R42 Dizziness and giddiness: Secondary | ICD-10-CM | POA: Diagnosis present

## 2014-10-09 DIAGNOSIS — Y9289 Other specified places as the place of occurrence of the external cause: Secondary | ICD-10-CM | POA: Diagnosis not present

## 2014-10-09 DIAGNOSIS — Y9389 Activity, other specified: Secondary | ICD-10-CM | POA: Diagnosis not present

## 2014-10-09 DIAGNOSIS — X30XXXA Exposure to excessive natural heat, initial encounter: Secondary | ICD-10-CM | POA: Diagnosis not present

## 2014-10-09 DIAGNOSIS — I10 Essential (primary) hypertension: Secondary | ICD-10-CM | POA: Insufficient documentation

## 2014-10-09 DIAGNOSIS — Y998 Other external cause status: Secondary | ICD-10-CM | POA: Diagnosis not present

## 2014-10-09 DIAGNOSIS — T679XXA Effect of heat and light, unspecified, initial encounter: Secondary | ICD-10-CM

## 2014-10-09 DIAGNOSIS — E860 Dehydration: Secondary | ICD-10-CM | POA: Insufficient documentation

## 2014-10-09 DIAGNOSIS — T673XXA Heat exhaustion, anhydrotic, initial encounter: Secondary | ICD-10-CM | POA: Insufficient documentation

## 2014-10-09 LAB — URINALYSIS COMPLETE WITH MICROSCOPIC (ARMC ONLY)
Bacteria, UA: NONE SEEN
Bilirubin Urine: NEGATIVE
Glucose, UA: >500 mg/dL — AB
HGB URINE DIPSTICK: NEGATIVE
LEUKOCYTES UA: NEGATIVE
Nitrite: NEGATIVE
PH: 5 (ref 5.0–8.0)
PROTEIN: NEGATIVE mg/dL
SPECIFIC GRAVITY, URINE: 1.025 (ref 1.005–1.030)

## 2014-10-09 LAB — CBC
HEMATOCRIT: 35.8 % — AB (ref 40.0–52.0)
HEMOGLOBIN: 11.4 g/dL — AB (ref 13.0–18.0)
MCH: 25.7 pg — ABNORMAL LOW (ref 26.0–34.0)
MCHC: 32 g/dL (ref 32.0–36.0)
MCV: 80.4 fL (ref 80.0–100.0)
Platelets: 195 10*3/uL (ref 150–440)
RBC: 4.45 MIL/uL (ref 4.40–5.90)
RDW: 18.1 % — ABNORMAL HIGH (ref 11.5–14.5)
WBC: 6.9 10*3/uL (ref 3.8–10.6)

## 2014-10-09 LAB — BASIC METABOLIC PANEL
ANION GAP: 11 (ref 5–15)
BUN: 14 mg/dL (ref 6–20)
CO2: 23 mmol/L (ref 22–32)
Calcium: 8.5 mg/dL — ABNORMAL LOW (ref 8.9–10.3)
Chloride: 106 mmol/L (ref 101–111)
Creatinine, Ser: 0.93 mg/dL (ref 0.61–1.24)
GLUCOSE: 117 mg/dL — AB (ref 65–99)
POTASSIUM: 4 mmol/L (ref 3.5–5.1)
Sodium: 140 mmol/L (ref 135–145)

## 2014-10-09 LAB — TROPONIN I: Troponin I: <0.03 ng/mL (ref <0.031)

## 2014-10-09 MED ORDER — SODIUM CHLORIDE 0.9 % IV SOLN
Freq: Once | INTRAVENOUS | Status: AC
  Administered 2014-10-09: 15:00:00 via INTRAVENOUS

## 2014-10-09 NOTE — Discharge Instructions (Signed)
Dizziness °Dizziness is a common problem. It is a feeling of unsteadiness or light-headedness. You may feel like you are about to faint. Dizziness can lead to injury if you stumble or fall. A person of any age group can suffer from dizziness, but dizziness is more common in older adults. °CAUSES  °Dizziness can be caused by many different things, including: °· Middle ear problems. °· Standing for too long. °· Infections. °· An allergic reaction. °· Aging. °· An emotional response to something, such as the sight of blood. °· Side effects of medicines. °· Tiredness. °· Problems with circulation or blood pressure. °· Excessive use of alcohol or medicines, or illegal drug use. °· Breathing too fast (hyperventilation). °· An irregular heart rhythm (arrhythmia). °· A low red blood cell count (anemia). °· Pregnancy. °· Vomiting, diarrhea, fever, or other illnesses that cause body fluid loss (dehydration). °· Diseases or conditions such as Parkinson's disease, high blood pressure (hypertension), diabetes, and thyroid problems. °· Exposure to extreme heat. °DIAGNOSIS  °Your health care provider will ask about your symptoms, perform a physical exam, and perform an electrocardiogram (ECG) to record the electrical activity of your heart. Your health care provider may also perform other heart or blood tests to determine the cause of your dizziness. These may include: °· Transthoracic echocardiogram (TTE). During echocardiography, sound waves are used to evaluate how blood flows through your heart. °· Transesophageal echocardiogram (TEE). °· Cardiac monitoring. This allows your health care provider to monitor your heart rate and rhythm in real time. °· Holter monitor. This is a portable device that records your heartbeat and can help diagnose heart arrhythmias. It allows your health care provider to track your heart activity for several days if needed. °· Stress tests by exercise or by giving medicine that makes the heart beat  faster. °TREATMENT  °Treatment of dizziness depends on the cause of your symptoms and can vary greatly. °HOME CARE INSTRUCTIONS  °· Drink enough fluids to keep your urine clear or pale yellow. This is especially important in very hot weather. In older adults, it is also important in cold weather. °· Take your medicine exactly as directed if your dizziness is caused by medicines. When taking blood pressure medicines, it is especially important to get up slowly. °· Rise slowly from chairs and steady yourself until you feel okay. °· In the morning, first sit up on the side of the bed. When you feel okay, stand slowly while holding onto something until you know your balance is fine. °· Move your legs often if you need to stand in one place for a long time. Tighten and relax your muscles in your legs while standing. °· Have someone stay with you for 1-2 days if dizziness continues to be a problem. Do this until you feel you are well enough to stay alone. Have the person call your health care provider if he or she notices changes in you that are concerning. °· Do not drive or use heavy machinery if you feel dizzy. °· Do not drink alcohol. °SEEK IMMEDIATE MEDICAL CARE IF:  °· Your dizziness or light-headedness gets worse. °· You feel nauseous or vomit. °· You have problems talking, walking, or using your arms, hands, or legs. °· You feel weak. °· You are not thinking clearly or you have trouble forming sentences. It may take a friend or family member to notice this. °· You have chest pain, abdominal pain, shortness of breath, or sweating. °· Your vision changes. °· You notice   any bleeding.  You have side effects from medicine that seems to be getting worse rather than better. MAKE SURE YOU:   Understand these instructions.  Will watch your condition.  Will get help right away if you are not doing well or get worse. Document Released: 07/24/2000 Document Revised: 02/02/2013 Document Reviewed: 08/17/2010 [facility]  Patient Information 2015 [place], [place]. This information is not intended to replace advice given to you by your health care provider. Make sure you discuss any questions you have with your health care provider.  Heat-Related Illness Heat-related illnesses occur when the body is unable to properly cool itself. The body normally cools itself by sweating. However, under some conditions sweating is not enough. In these cases, a person's body temperature rises rapidly. Very high body temperatures may damage the brain or other vital organs. Some examples of heat-related illnesses include:  Heat stroke. This occurs when the body is unable to regulate its temperature. The body's temperature rises rapidly, the sweating mechanism fails, and the body is unable to cool down. Body temperature may rise to 106 F (41 C) or higher within 10 to 15 minutes. Heat stroke can cause death or permanent disability if emergency treatment is not provided.  Heat exhaustion. This is a milder form of heat-related illness that can develop after several days of exposure to high temperatures and not enough fluids. It is the body's response to an excessive loss of the water and salt contained in sweat.  Heat cramps. These usually affect people who sweat a lot during heavy activity. This sweating drains the body's salt and moisture. The low salt level in the muscles causes painful cramps. Heat cramps may also be a symptom of heat exhaustion. Heat cramps usually occur in the abdomen, arms, or legs. Get medical attention for cramps if you have heart problems or are on a low-sodium diet. Those that are at greatest risk for heat-related illnesses include:   The elderly.  Infant and the very young.  People with mental illness and chronic diseases.  People who are overweight (obese).  Young and healthy people can even succumb to heat if they participate in strenuous physical activities during hot weather. CAUSES  Several factors  affect the body's ability to cool itself during extremely hot weather. When the humidity is high, sweat will not evaporate as quickly. This prevents the body from releasing heat quickly. Other factors that can affect the body's ability to cool down include:   Age.  Obesity.  Fever.  Dehydration.  Heart disease.  Mental illness.  Poor circulation.  Sunburn.  Prescription drug use.  Alcohol use. SYMPTOMS  Heat stroke: Warning signs of heat stroke vary, but may include:  An extremely high body temperature (above 103F orally).  A fast, strong pulse.  Dizziness.  Confusion.  Red, hot, and dry skin.  No sweating.  Throbbing headache.  Feeling sick to your stomach (nauseous).  Unconsciousness. Heat exhaustion: Warning signs of heat exhaustion include:  Heavy sweating.  Tiredness.  Headache.  Paleness.  Weakness.  Feeling sick to your stomach (nauseous) or vomiting.  Muscle cramps. Heat cramps  Muscle pains or spasms. TREATMENT  Heat stroke  Get into a cool environment. An indoor place that is air-conditioned may be best.  Take a cool shower or bath. Have someone around to make sure you are okay.  Take your temperature. Make sure it is going down. Heat exhaustion  Drink plenty of fluids. Do not drink liquids that contain caffeine, alcohol, or large  amounts of sugar. These cause you to lose more body fluid. Also, avoid very cold drinks. They can cause stomach cramps.  Get into a cool environment. An indoor place that is air-conditioned may be best.  Take a cool shower or bath. Have someone around to make sure you are okay.  Put on lightweight clothing. Heat cramps  Stop whatever activity you were doing. Do not attempt to do that activity for at least 3 hours after the cramps have gone away.  Get into a cool environment. An indoor place that is air-conditioned may be best. [place]  To protect your health when temperatures are  extremely high, follow these tips:  During heavy exercise in a hot environment, drink two to four glasses (16-32 ounces) of cool fluids each hour. Do not wait until you are thirsty to drink. Warning: If your caregiver limits the amount of fluid you drink or has you on water pills, ask how much you should drink while the weather is hot.  Do not drink liquids that contain caffeine, alcohol, or large amounts of sugar. These cause you to lose more body fluid.  Avoid very cold drinks. They can cause stomach cramps.  Wear appropriate clothing. Choose lightweight, light-colored, loose-fitting clothing.  If you must be outdoors, try to limit your outdoor activity to morning and evening hours. Try to rest often in shady areas.  If you are not used to working or exercising in a hot environment, start slowly and pick up the pace gradually.  Stay cool in an air-conditioned place if possible. If your home does not have air conditioning, go to the shopping mall or [employer].  Taking a cool shower or bath may help you cool off. SEEK MEDICAL CARE IF:   You see any of the symptoms listed above. You may be dealing with a life-threatening emergency.  Symptoms worsen or last longer than 1 hour.  Heat cramps do not get better in 1 hour. MAKE SURE YOU:   Understand these instructions.  Will watch your condition.  Will get help right away if you are not doing well or get worse. Document Released: 11/07/2007 Document Revised: 04/22/2011 Document Reviewed: 11/07/2007 [facility] Patient Information 2015 [place], [place]. This information is not intended to replace advice given to you by your health care provider. Make sure you discuss any questions you have with your health care provider.

## 2014-10-09 NOTE — ED Notes (Signed)
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  This afternoon was standing and talking in a conversation and felt "dizzy".  Patient states he feels fine now.  Received 500 cc NS IVB from EMS PTA.

## 2014-10-09 NOTE — ED Provider Notes (Signed)
[facility] Emergency Department Provider Note     Time seen: ----------------------------------------- 2:53 PM on 10/09/2014 -----------------------------------------    I have reviewed the triage vital signs and the nursing notes.   HISTORY  Chief Complaint Dizziness    HPI Andre Edwards is a 69 y.o. male who presents to ER for dizziness feel like he was given a fall. 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 Patient states when he sits down he feels better, received some saline in route by EMS. Denies any recent illness or medication change. Patient thinks maybe he just didn't drink enough fluids.   Past Medical History  Diagnosis Date  . Cataract   . Diabetes mellitus without complication   . Pneumonia 02/2013  . Enlarged prostate   . Hypertension   . Hyperlipidemia   . Sleep apnea     Patient Active Problem List   Diagnosis Date Noted  . Benign fibroma of prostate 06/29/2014  . Burn 06/29/2014  . Diabetes mellitus, type 2 06/29/2014  . Abnormal prostate specific antigen 06/29/2014  . Allergy to environmental factors 06/29/2014  . HLD (hyperlipidemia) 06/29/2014  . BP (high blood pressure) 06/29/2014  . Stasis, venous 06/29/2014    Past Surgical History  Procedure Laterality Date  . Eye surgery Bilateral 2015  . Prostate surgery N/A   . Cholecystectomy    . Cholecystectomy N/A 07/04/2014    Procedure: LAPAROSCOPIC CHOLECYSTECTOMY;  Surgeon: Christene Lye, MD;  Location: [facility] ORS;  Service: General;  Laterality: N/A;    Allergies Erythromycin  Social History Social History  Substance Use Topics  . Smoking status: Never Smoker   . Smokeless tobacco: Current User    Types: Chew  . Alcohol Use: No    Review of Systems Constitutional: Negative for fever. Eyes: Negative for visual changes. ENT: Negative for sore throat. Cardiovascular: Negative for chest pain. Respiratory: Negative  for shortness of breath. Gastrointestinal: Negative for abdominal pain, vomiting and diarrhea. Genitourinary: Negative for dysuria. Musculoskeletal: Negative for back pain. Skin: Negative for rash. Neurological: Negative for headaches, focal weakness or numbness. Positive for dizziness  10-point ROS otherwise negative.  ____________________________________________   PHYSICAL EXAM:  VITAL SIGNS: ED Triage Vitals  Enc Vitals Group     BP 10/09/14 1446 127/69 mmHg     Pulse Rate 10/09/14 1446 72     Resp 10/09/14 1446 18     Temp 10/09/14 1446 97.6 F (36.4 C)     Temp Source 10/09/14 1446 Oral     SpO2 10/09/14 1446 97 %     Weight 10/09/14 1446 225 lb (102.059 kg)     Height 10/09/14 1446 5\' 9"  (1.753 m)     Head Cir --      Peak Flow --      Pain Score 10/09/14 1444 0     Pain Loc --      Pain Edu? --      Excl. in [place]? --     Constitutional: Alert and oriented. Well appearing and in no distress. Eyes: Conjunctivae are normal. PERRL. Normal extraocular movements. ENT   Head: Normocephalic and atraumatic.   Nose: No congestion/rhinnorhea.   Mouth/Throat: Mucous membranes are moist.   Neck: No stridor. Cardiovascular: Normal rate, regular rhythm. Normal and symmetric distal pulses are present in all extremities. No murmurs, rubs, or gallops. Respiratory: Normal respiratory effort without tachypnea nor retractions. Breath sounds are clear and equal bilaterally. No wheezes/rales/rhonchi. Gastrointestinal:  Soft and nontender. No distention. No abdominal bruits.  Musculoskeletal: Nontender with normal range of motion in all extremities. No joint effusions.  No lower extremity tenderness nor edema. Neurologic:  Normal speech and language. No gross focal neurologic deficits are appreciated. Speech is normal. No gait instability. Skin:  Skin is warm, dry and intact. No rash noted. Psychiatric: Mood and affect are normal. Speech and behavior are normal. Patient  exhibits appropriate insight and judgment. ____________________________________________  EKG: Interpreted by me. Normal sinus rhythm with first-degree AV block, PVCs, left axis deviation. Rate is 74 bpm  ____________________________________________  ED COURSE:  Pertinent labs & imaging results that were available during my care of the patient were reviewed by me and considered in my medical decision making (see chart for details). Patient likely with heat related illness and mild dehydration. Will receive additional saline here, patient not orthostatic when checked by me. We'll check basic labs. ____________________________________________    LABS (pertinent positives/negatives)  Labs Reviewed  BASIC METABOLIC PANEL - Abnormal; Notable for the following:    Glucose, Bld 117 (*)    Calcium 8.5 (*)    All other components within normal limits  CBC - Abnormal; Notable for the following:    Hemoglobin 11.4 (*)    HCT 35.8 (*)    MCH 25.7 (*)    RDW 18.1 (*)    All other components within normal limits  URINALYSIS COMPLETEWITH MICROSCOPIC ([facility] ONLY) - Abnormal; Notable for the following:    Color, Urine YELLOW (*)    APPearance CLEAR (*)    Glucose, UA >500 (*)    Ketones, ur TRACE (*)    Squamous Epithelial / LPF 0-5 (*)    All other components within normal limits  TROPONIN I   ____________________________________________  FINAL ASSESSMENT AND PLAN  Heat related illness, dehydration  Plan: Patient with labs and imaging as dictated above. Patient is feeling better after saline boluses. He is stable for outpatient follow-up with his doctor in the next 2 days for reevaluation.   Earleen Newport, MD   Earleen Newport, MD 10/09/14 [phone]

## 2014-10-09 NOTE — ED Notes (Signed)
AAOx3.  Skin warm and dry.  Ambulates with easy and steady gait.  Moving all extremities equally and strong.

## 2015-07-26 ENCOUNTER — Ambulatory Visit: Payer: Medicare Other

## 2015-08-09 ENCOUNTER — Ambulatory Visit: Payer: Medicare Other

## 2015-08-22 ENCOUNTER — Encounter: Payer: Self-pay | Admitting: Urology

## 2015-08-22 ENCOUNTER — Ambulatory Visit (INDEPENDENT_AMBULATORY_CARE_PROVIDER_SITE_OTHER): Payer: BLUE CROSS/BLUE SHIELD | Admitting: Urology

## 2015-08-22 VITALS — BP 122/74 | HR 87 | Ht 70.0 in | Wt 226.3 lb

## 2015-08-22 DIAGNOSIS — R972 Elevated prostate specific antigen [PSA]: Secondary | ICD-10-CM | POA: Insufficient documentation

## 2015-08-22 DIAGNOSIS — N400 Enlarged prostate without lower urinary tract symptoms: Secondary | ICD-10-CM

## 2015-08-22 DIAGNOSIS — R5382 Chronic fatigue, unspecified: Secondary | ICD-10-CM

## 2015-08-22 DIAGNOSIS — E119 Type 2 diabetes mellitus without complications: Secondary | ICD-10-CM | POA: Insufficient documentation

## 2015-08-22 LAB — URINALYSIS, COMPLETE
Bilirubin, UA: NEGATIVE
Ketones, UA: NEGATIVE
Leukocytes, UA: NEGATIVE
NITRITE UA: NEGATIVE
PH UA: 5 (ref 5.0–7.5)
Protein, UA: NEGATIVE
RBC, UA: NEGATIVE
Specific Gravity, UA: 1.01 (ref 1.005–1.030)
UUROB: 0.2 mg/dL (ref 0.2–1.0)

## 2015-08-22 LAB — MICROSCOPIC EXAMINATION
BACTERIA UA: NONE SEEN
Epithelial Cells (non renal): NONE SEEN /hpf (ref 0–10)
RBC, UA: NONE SEEN /hpf (ref 0–?)

## 2015-08-22 LAB — BLADDER SCAN AMB NON-IMAGING: Scan Result: 208

## 2015-08-22 NOTE — Progress Notes (Signed)
08/22/2015 10:58 AM   Andre Edwards 03/03/1945 [id number]  Referring provider: Idelle Crouch, MD [place] [facility] [place], [place] [address]  Chief Complaint  Patient presents with  . Follow-up    BPH    HPI: Andre Edwards is a 70yo here for followup for BPH with LUTS. He has a long h/o BPH with negative BX 2013 and s/p KTP laser by [facility] 2013. Now manages on daily tamsulosin with improvement in his urgency and frequency.  His biggest complaint today is fatigue which has been worsening over the past 6 months .  Decreased libido. He has gained 20 lbs.     PMH: Past Medical History  Diagnosis Date  . Cataract   . Diabetes mellitus without complication ([place])   . Pneumonia 02/2013  . Enlarged prostate   . Hypertension   . Hyperlipidemia   . Sleep apnea     Surgical History: Past Surgical History  Procedure Laterality Date  . Eye surgery Bilateral 2015  . Prostate surgery N/A   . Cholecystectomy    . Cholecystectomy N/A 07/04/2014    Procedure: LAPAROSCOPIC CHOLECYSTECTOMY;  Surgeon: Christene Lye, MD;  Location: [facility] ORS;  Service: General;  Laterality: N/A;    Home Medications:    Medication List       This list is accurate as of: 08/22/15 10:58 AM.  Always use your most recent med list.               aspirin 81 MG chewable tablet  Chew 81 mg by mouth daily.     atorvastatin 10 MG tablet  Commonly known as:  LIPITOR  Take 10 mg by mouth daily.     canagliflozin 300 MG Tabs tablet  Commonly known as:  INVOKANA  Take 300 mg by mouth daily after breakfast.     cetirizine 10 MG tablet  Commonly known as:  ZYRTEC  Take 10 mg by mouth daily.     Chromium-Cinnamon 50-500 MCG-MG Caps  Take 500 mg/kg/day by mouth daily.     diltiazem 120 MG 24 hr tablet  Commonly known as:  CARDIZEM LA  Take 240 mg by mouth daily.     diltiazem 240 MG 24 hr capsule  Commonly known as:  DILACOR XR  Take by mouth.     docusate sodium 100  MG capsule  Commonly known as:  COLACE  Take 100 mg by mouth daily.     glipiZIDE 10 MG tablet  Commonly known as:  GLUCOTROL  Take 10 mg by mouth daily before breakfast.     hydrochlorothiazide 25 MG tablet  Commonly known as:  HYDRODIURIL  Take 25 mg by mouth daily.     metFORMIN 1000 MG tablet  Commonly known as:  GLUCOPHAGE  Take 1,000 mg by mouth 2 (two) times daily with a meal.     oxyCODONE-acetaminophen 5-325 MG tablet  Commonly known as:  ROXICET  Take 1 tablet by mouth every 4 (four) hours as needed for moderate pain or severe pain.     phenylephrine 10 MG Tabs tablet  Commonly known as:  SUDAFED PE  Take 10 mg by mouth every 4 (four) hours as needed.     tamsulosin 0.4 MG Caps capsule  Commonly known as:  FLOMAX  Take 1 capsule (0.4 mg total) by mouth daily.     vitamin B-12 1000 MCG tablet  Commonly known as:  CYANOCOBALAMIN  Take 1,000 mcg by mouth daily.  Allergies:  Allergies  Allergen Reactions  . Amoxicillin-Pot Clavulanate Other (See Comments)  . Erythromycin Itching and Swelling  . Levofloxacin Itching    tongue    Family History: No family history on file.  Social History:  reports that he has never smoked. His smokeless tobacco use includes Chew. He reports that he does not drink alcohol or use illicit drugs.  ROS: UROLOGY Frequent Urination?: No Hard to postpone urination?: Yes Burning/pain with urination?: No Get up at night to urinate?: No Leakage of urine?: No Urine stream starts and stops?: No Trouble starting stream?: No Do you have to strain to urinate?: No Blood in urine?: No Urinary tract infection?: No Sexually transmitted disease?: No Injury to kidneys or bladder?: No Painful intercourse?: No Weak stream?: No Erection problems?: Yes Penile pain?: No  Gastrointestinal Nausea?: No Vomiting?: No Indigestion/heartburn?: No Diarrhea?: No Constipation?: No  Constitutional Fever: No Night sweats?: No Weight  loss?: No Fatigue?: Yes  Skin Skin rash/lesions?: No Itching?: No  Eyes Blurred vision?: No Double vision?: No  Ears/Nose/Throat Sore throat?: No Sinus problems?: No  Hematologic/Lymphatic Swollen glands?: No Easy bruising?: No  Cardiovascular Leg swelling?: No Chest pain?: No  Respiratory Cough?: No Shortness of breath?: No  Endocrine Excessive thirst?: No  Musculoskeletal Back pain?: No Joint pain?: No  Neurological Headaches?: No Dizziness?: No  Psychologic Depression?: No Anxiety?: No  Physical Exam: BP 122/74 mmHg  Pulse 87  Ht 5\' 10"  (1.778 m)  Wt 102.649 kg (226 lb 4.8 oz)  BMI 32.47 kg/m2  Constitutional:  Alert and oriented, No acute distress. HEENT: [place] AT, moist mucus membranes.  Trachea midline, no masses. Cardiovascular: No clubbing, cyanosis, or edema. Respiratory: Normal respiratory effort, no increased work of breathing. GI: Abdomen is soft, nontender, nondistended, no abdominal masses GU: No CVA tenderness. Uncircumcised phallus. No masses/lesions on penis, testes, scrotum. Prostate 50g, smooth, no nodules, no induration. Normal rectal tone Skin: No rashes, bruises or suspicious lesions. Lymph: No cervical or inguinal adenopathy. Neurologic: Grossly intact, no focal deficits, moving all 4 extremities. Psychiatric: Normal mood and affect.  Laboratory Data: Lab Results  Component Value Date   WBC 6.9 10/09/2014   HGB 11.4* 10/09/2014   HCT 35.8* 10/09/2014   MCV 80.4 10/09/2014   PLT 195 10/09/2014    Lab Results  Component Value Date   CREATININE 0.93 10/09/2014    No results found for: PSA  No results found for: TESTOSTERONE  No results found for: HGBA1C  Urinalysis    Component Value Date/Time   COLORURINE YELLOW* 10/09/2014 1550   COLORURINE Yellow 03/12/2013 0013   APPEARANCEUR CLEAR* 10/09/2014 1550   APPEARANCEUR Clear 07/26/2014 0957   APPEARANCEUR Clear 03/12/2013 0013   LABSPEC 1.025 10/09/2014 1550    LABSPEC 1.026 03/12/2013 0013   PHURINE 5.0 10/09/2014 1550   PHURINE 5.0 03/12/2013 0013   GLUCOSEU >500* 10/09/2014 1550   GLUCOSEU >=500 03/12/2013 0013   HGBUR NEGATIVE 10/09/2014 1550   HGBUR Negative 03/12/2013 0013   BILIRUBINUR NEGATIVE 10/09/2014 1550   BILIRUBINUR Negative 07/26/2014 0957   BILIRUBINUR Negative 03/12/2013 0013   KETONESUR TRACE* 10/09/2014 1550   KETONESUR Trace 03/12/2013 0013   PROTEINUR NEGATIVE 10/09/2014 1550   PROTEINUR Negative 07/26/2014 0957   PROTEINUR Negative 03/12/2013 0013   NITRITE NEGATIVE 10/09/2014 1550   NITRITE Negative 07/26/2014 0957   NITRITE Negative 03/12/2013 0013   LEUKOCYTESUR NEGATIVE 10/09/2014 1550   LEUKOCYTESUR Negative 07/26/2014 0957   LEUKOCYTESUR Negative 03/12/2013 0013    Pertinent  Imaging: none  Assessment & Plan:    1. Benign fibroma of prostate - continue flomax 0.4mg  daily -RTC 1 year - Urinalysis, Complete - Bladder Scan (Post Void Residual) in office  2. Fatigue -testosterone free and total, B12 will call with results -if normal RTC 1 year, if low RTC 1 month to discuss treatemnt   No Follow-up on file.  Nicolette Bang, MD  [facility] [address], [place],  [address] [phone]

## 2015-08-23 LAB — TESTOSTERONE,FREE AND TOTAL
TESTOSTERONE FREE: 6.5 pg/mL — AB (ref 6.6–18.1)
Testosterone: 204 ng/dL — ABNORMAL LOW (ref 348–1197)

## 2015-08-23 LAB — VITAMIN B12: VITAMIN B 12: 909 pg/mL (ref 211–946)

## 2015-09-19 ENCOUNTER — Other Ambulatory Visit: Payer: Self-pay

## 2015-09-19 ENCOUNTER — Ambulatory Visit (INDEPENDENT_AMBULATORY_CARE_PROVIDER_SITE_OTHER): Payer: BLUE CROSS/BLUE SHIELD | Admitting: Urology

## 2015-09-19 VITALS — BP 118/78 | HR 76 | Ht 70.0 in | Wt 227.3 lb

## 2015-09-19 DIAGNOSIS — E291 Testicular hypofunction: Secondary | ICD-10-CM

## 2015-09-19 NOTE — Progress Notes (Signed)
09/19/2015 11:47 AM   Andre Edwards 1945-08-05 [id number]  Referring provider: Idelle Crouch, MD [facility] [place], [place] [address]  Chief Complaint  Patient presents with  . Follow-up    hypogonadism, chronic fatigue     HPI: Andre Edwards is a 70yo here for hypogonadism. He had labs drawn for for fatigue and was found to have testoserone of 204. Normal B12. He has worsening fatigue for the past 6 months    PMH: Past Medical History:  Diagnosis Date  . Cataract   . Diabetes mellitus without complication ([place])   . Enlarged prostate   . Hyperlipidemia   . Hypertension   . Pneumonia 02/2013  . Sleep apnea     Surgical History: Past Surgical History:  Procedure Laterality Date  . CHOLECYSTECTOMY    . CHOLECYSTECTOMY N/A 07/04/2014   Procedure: LAPAROSCOPIC CHOLECYSTECTOMY;  Surgeon: Christene Lye, MD;  Location: [facility] ORS;  Service: General;  Laterality: N/A;  . EYE SURGERY Bilateral 2015  . PROSTATE SURGERY N/A     Home Medications:    Medication List       Accurate as of 09/19/15 11:47 AM. Always use your most recent med list.          aspirin 81 MG chewable tablet Chew 81 mg by mouth daily.   atorvastatin 10 MG tablet Commonly known as:  LIPITOR Take 10 mg by mouth daily.   canagliflozin 300 MG Tabs tablet Commonly known as:  INVOKANA Take 300 mg by mouth daily after breakfast.   cetirizine 10 MG tablet Commonly known as:  ZYRTEC Take 10 mg by mouth daily.   Chromium-Cinnamon 50-500 MCG-MG Caps Take 500 mg/kg/day by mouth daily.   diltiazem 120 MG 24 hr tablet Commonly known as:  CARDIZEM LA Take 240 mg by mouth daily.   diltiazem 240 MG 24 hr capsule Commonly known as:  DILACOR XR Take by mouth.   docusate sodium 100 MG capsule Commonly known as:  COLACE Take 100 mg by mouth daily.   glipiZIDE 10 MG tablet Commonly known as:  GLUCOTROL Take 10 mg by mouth daily before breakfast.     hydrochlorothiazide 25 MG tablet Commonly known as:  HYDRODIURIL Take 25 mg by mouth daily.   metFORMIN 1000 MG tablet Commonly known as:  GLUCOPHAGE Take 1,000 mg by mouth 2 (two) times daily with a meal.   phenylephrine 10 MG Tabs tablet Commonly known as:  SUDAFED PE Take 10 mg by mouth every 4 (four) hours as needed.   tamsulosin 0.4 MG Caps capsule Commonly known as:  FLOMAX Take 1 capsule (0.4 mg total) by mouth daily.   vitamin B-12 1000 MCG tablet Commonly known as:  CYANOCOBALAMIN Take 1,000 mcg by mouth daily.       Allergies:  Allergies  Allergen Reactions  . Amoxicillin-Pot Clavulanate Other (See Comments)  . Erythromycin Itching and Swelling  . Levofloxacin Itching    tongue  . Sulfa Antibiotics     Other reaction(s): Unknown  . Tetracycline     Other reaction(s): Unknown    Family History: No family history on file.  Social History:  reports that he has never smoked. His smokeless tobacco use includes Chew. He reports that he does not drink alcohol or use drugs.  ROS: UROLOGY Frequent Urination?: No Hard to postpone urination?: No Burning/pain with urination?: No Get up at night to urinate?: No Leakage of urine?: No Urine stream starts and stops?: No Trouble  starting stream?: No Do you have to strain to urinate?: No Blood in urine?: No Urinary tract infection?: No Sexually transmitted disease?: No Injury to kidneys or bladder?: No Painful intercourse?: No Weak stream?: No Erection problems?: Yes Penile pain?: No  Gastrointestinal Nausea?: No Vomiting?: No Indigestion/heartburn?: No Diarrhea?: No Constipation?: No  Constitutional Fever: No Night sweats?: No Weight loss?: No Fatigue?: Yes  Skin Skin rash/lesions?: No Itching?: No  Eyes Blurred vision?: No Double vision?: No  Ears/Nose/Throat Sore throat?: No Sinus problems?: No  Hematologic/Lymphatic Swollen glands?: No Easy bruising?: No  Cardiovascular Leg  swelling?: No Chest pain?: No  Respiratory Cough?: No Shortness of breath?: No  Endocrine Excessive thirst?: No  Musculoskeletal Back pain?: No Joint pain?: Yes  Neurological Headaches?: No Dizziness?: No  Psychologic Depression?: No Anxiety?: No  Physical Exam: BP 118/78   Pulse 76   Ht 5\' 10"  (1.778 m)   Wt 103.1 kg (227 lb 4.8 oz)   BMI 32.61 kg/m   Constitutional:  Alert and oriented, No acute distress. HEENT: [place] AT, moist mucus membranes.  Trachea midline, no masses. Cardiovascular: No clubbing, cyanosis, or edema. Respiratory: Normal respiratory effort, no increased work of breathing. GI: Abdomen is soft, nontender, nondistended, no abdominal masses GU: No CVA tenderness.  Skin: No rashes, bruises or suspicious lesions. Lymph: No cervical or inguinal adenopathy. Neurologic: Grossly intact, no focal deficits, moving all 4 extremities. Psychiatric: Normal mood and affect.  Laboratory Data: Lab Results  Component Value Date   WBC 6.9 10/09/2014   HGB 11.4 (L) 10/09/2014   HCT 35.8 (L) 10/09/2014   MCV 80.4 10/09/2014   PLT 195 10/09/2014    Lab Results  Component Value Date   CREATININE 0.93 10/09/2014    No results found for: PSA  Lab Results  Component Value Date   TESTOSTERONE 204 (L) 08/22/2015    No results found for: HGBA1C  Urinalysis    Component Value Date/Time   COLORURINE YELLOW (A) 10/09/2014 1550   APPEARANCEUR Clear 08/22/2015 1105   LABSPEC 1.025 10/09/2014 1550   LABSPEC 1.026 03/12/2013 0013   PHURINE 5.0 10/09/2014 1550   GLUCOSEU 3+ (A) 08/22/2015 1105   GLUCOSEU >=500 03/12/2013 0013   HGBUR NEGATIVE 10/09/2014 1550   BILIRUBINUR Negative 08/22/2015 1105   BILIRUBINUR Negative 03/12/2013 0013   KETONESUR TRACE (A) 10/09/2014 1550   PROTEINUR Negative 08/22/2015 1105   PROTEINUR NEGATIVE 10/09/2014 1550   NITRITE Negative 08/22/2015 1105   NITRITE NEGATIVE 10/09/2014 1550   LEUKOCYTESUR Negative 08/22/2015 1105    LEUKOCYTESUR Negative 03/12/2013 0013    Pertinent Imaging: none  Assessment & Plan:    1. Hypogonadism -We discussed TRT options including injections, gels, and impkants. After discussing treatment the patient wishes to proceed with Testopel  There are no diagnoses linked to this encounter.  No Follow-up on file.  Nicolette Bang, MD  [facility] [address], [place], [place] [address] [phone]

## 2015-09-20 ENCOUNTER — Other Ambulatory Visit: Payer: BLUE CROSS/BLUE SHIELD

## 2015-09-21 ENCOUNTER — Telehealth: Payer: Self-pay | Admitting: *Deleted

## 2015-09-21 ENCOUNTER — Other Ambulatory Visit: Payer: BLUE CROSS/BLUE SHIELD

## 2015-09-21 NOTE — Telephone Encounter (Signed)
Spoke with patient about second testosterone value. I had called patient yesterday because some one put him on the lab schedule for after 9. I let him know that [employer] has to have two lab draws before 9 am to attempt to approve his testopel., So I scheduled him a lab appointment for the 10th at 8:30. Patient called and cancelled and said he would try to come in Monday. I let patient know that I would not be sending his Testopel form in until that is done and I get the results. I would make him another appointment for Monday for 8:30 and he can't make this appointment he needs to reschedule and let me know when it's complete so I can send his form in. Patient states he understands.

## 2015-09-24 DIAGNOSIS — E291 Testicular hypofunction: Secondary | ICD-10-CM | POA: Insufficient documentation

## 2015-09-25 ENCOUNTER — Other Ambulatory Visit: Payer: BLUE CROSS/BLUE SHIELD

## 2015-09-25 DIAGNOSIS — E291 Testicular hypofunction: Secondary | ICD-10-CM

## 2015-09-26 LAB — TESTOSTERONE: TESTOSTERONE: 267 ng/dL (ref 264–916)

## 2015-10-12 ENCOUNTER — Telehealth: Payer: Self-pay | Admitting: *Deleted

## 2015-10-12 NOTE — Telephone Encounter (Signed)
Trying to get patient's Testopel approved. Insurance is asking for a letter of medical necessity from the Dr. And documentation of step therapy. (any medications he has ever tried and failed on for this condition). Note sent to Dr. Alyson Ingles.

## 2015-10-27 ENCOUNTER — Telehealth: Payer: Self-pay | Admitting: *Deleted

## 2015-10-27 NOTE — Telephone Encounter (Signed)
Spoke with Patient about Testopel. Insurance wants documentation of step therapy and patient states he hasn't tried anything. I let him know that his insurance would not cover the Testopel a Until he has tried and failed other medications. Patient booked follow up appointment with Dr. Glade Lloyd October 3rd to talk about next step. Patient ok with plan. Patient also stated Dr. Doy Hutching told him not to do the Testopel.

## 2015-11-14 ENCOUNTER — Ambulatory Visit: Payer: BLUE CROSS/BLUE SHIELD

## 2015-11-24 ENCOUNTER — Ambulatory Visit: Payer: BLUE CROSS/BLUE SHIELD

## 2015-12-05 ENCOUNTER — Ambulatory Visit: Payer: BLUE CROSS/BLUE SHIELD | Admitting: Urology

## 2015-12-05 ENCOUNTER — Encounter: Payer: Self-pay | Admitting: Urology

## 2015-12-05 VITALS — BP 131/80 | HR 82 | Ht 69.5 in | Wt 234.8 lb

## 2015-12-05 DIAGNOSIS — E291 Testicular hypofunction: Secondary | ICD-10-CM | POA: Diagnosis not present

## 2015-12-05 MED ORDER — CLOMIPHENE CITRATE 50 MG PO TABS: 25.0000 mg | ORAL_TABLET | Freq: Every day | ORAL | 5 refills | Status: DC

## 2015-12-05 NOTE — Progress Notes (Signed)
12/05/2015 11:42 AM   Andre Edwards 1945-06-26 [id number]  Referring provider: Idelle Crouch, MD [place] [facility] [place], [place] [address]  Chief Complaint  Patient presents with  . Hypogonadism    HPI: Mr Andre Edwards is a 70yo here for hypogonadism. He had labs drawn for for fatigue and was found to have testoserone of 204. Normal B12. He has worsening fatigue for the past 6 months.  Interval hx: He continues to have fatigue. He has worsening issues with ED. He has issues getting and maintaining an erection. He has not tried any medications recently. No other associated symptoms. No exacerbating/alleviating events. He denies worsening LUTS   PMH: Past Medical History:  Diagnosis Date  . Cataract   . Diabetes mellitus without complication ([place])   . Enlarged prostate   . Hyperlipidemia   . Hypertension   . Pneumonia 02/2013  . Sleep apnea     Surgical History: Past Surgical History:  Procedure Laterality Date  . CHOLECYSTECTOMY    . CHOLECYSTECTOMY N/A 07/04/2014   Procedure: LAPAROSCOPIC CHOLECYSTECTOMY;  Surgeon: Christene Lye, MD;  Location: [facility] ORS;  Service: General;  Laterality: N/A;  . EYE SURGERY Bilateral 2015  . PROSTATE SURGERY N/A     Home Medications:    Medication List       Accurate as of 12/05/15 11:42 AM. Always use your most recent med list.          aspirin 81 MG chewable tablet Chew 81 mg by mouth daily.   atorvastatin 10 MG tablet Commonly known as:  LIPITOR Take 10 mg by mouth daily.   canagliflozin 300 MG Tabs tablet Commonly known as:  INVOKANA Take 300 mg by mouth daily after breakfast.   cetirizine 10 MG tablet Commonly known as:  ZYRTEC Take 10 mg by mouth daily.   Chromium-Cinnamon 50-500 MCG-MG Caps Take 500 mg/kg/day by mouth daily.   diltiazem 240 MG 24 hr capsule Commonly known as:  DILACOR XR Take by mouth.   diltiazem 240 MG 24 hr capsule Commonly known as:  DILACOR XR TAKE 1  CAPSULE (240 MG TOTAL) BY MOUTH ONCE DAILY.   docusate sodium 100 MG capsule Commonly known as:  COLACE Take 100 mg by mouth daily.   glipiZIDE 10 MG tablet Commonly known as:  GLUCOTROL Take 10 mg by mouth daily before breakfast.   hydrochlorothiazide 25 MG tablet Commonly known as:  HYDRODIURIL Take 25 mg by mouth daily.   metFORMIN 1000 MG tablet Commonly known as:  GLUCOPHAGE Take 1,000 mg by mouth 2 (two) times daily with a meal.   phenylephrine 10 MG Tabs tablet Commonly known as:  SUDAFED PE Take 10 mg by mouth every 4 (four) hours as needed.   tamsulosin 0.4 MG Caps capsule Commonly known as:  FLOMAX Take by mouth.   vitamin B-12 1000 MCG tablet Commonly known as:  CYANOCOBALAMIN Take 1,000 mcg by mouth daily.       Allergies:  Allergies  Allergen Reactions  . Amoxicillin-Pot Clavulanate Other (See Comments)  . Erythromycin Itching and Swelling  . Levofloxacin Itching    tongue  . Sulfa Antibiotics     Other reaction(s): Unknown  . Tetracycline     Other reaction(s): Unknown    Family History: No family history on file.  Social History:  reports that he has never smoked. His smokeless tobacco use includes Chew. He reports that he does not drink alcohol or use drugs.  ROS: UROLOGY Frequent  Urination?: No Hard to postpone urination?: No Burning/pain with urination?: No Get up at night to urinate?: No Leakage of urine?: No Urine stream starts and stops?: No Trouble starting stream?: No Do you have to strain to urinate?: No Blood in urine?: No Urinary tract infection?: No Sexually transmitted disease?: No Injury to kidneys or bladder?: No Painful intercourse?: No Weak stream?: No Erection problems?: Yes Penile pain?: No  Gastrointestinal Nausea?: No Vomiting?: No Indigestion/heartburn?: No Diarrhea?: No Constipation?: No  Constitutional Fever: No Night sweats?: No Weight loss?: No Fatigue?: Yes  Skin Skin rash/lesions?:  No Itching?: No  Eyes Blurred vision?: No Double vision?: No  Ears/Nose/Throat Sinus problems?: No  Hematologic/Lymphatic Swollen glands?: No Easy bruising?: No  Cardiovascular Leg swelling?: No Chest pain?: No  Respiratory Cough?: No Shortness of breath?: No  Endocrine Excessive thirst?: No  Musculoskeletal Back pain?: No Joint pain?: No  Neurological Headaches?: No Dizziness?: No  Psychologic Depression?: No Anxiety?: No  Physical Exam: BP 131/80   Pulse 82   Ht 5' 9.5" (1.765 m)   Wt 106.5 kg (234 lb 12.8 oz)   BMI 34.18 kg/m   Constitutional:  Alert and oriented, No acute distress. HEENT: [place] AT, moist mucus membranes.  Trachea midline, no masses. Cardiovascular: No clubbing, cyanosis, or edema. Respiratory: Normal respiratory effort, no increased work of breathing. GI: Abdomen is soft, nontender, nondistended, no abdominal masses GU: No CVA tenderness. Skin: No rashes, bruises or suspicious lesions. Lymph: No cervical or inguinal adenopathy. Neurologic: Grossly intact, no focal deficits, moving all 4 extremities. Psychiatric: Normal mood and affect.  Laboratory Data: Lab Results  Component Value Date   WBC 6.9 10/09/2014   HGB 11.4 (L) 10/09/2014   HCT 35.8 (L) 10/09/2014   MCV 80.4 10/09/2014   PLT 195 10/09/2014    Lab Results  Component Value Date   CREATININE 0.93 10/09/2014    No results found for: PSA  Lab Results  Component Value Date   TESTOSTERONE 267 09/25/2015    No results found for: HGBA1C  Urinalysis    Component Value Date/Time   COLORURINE YELLOW (A) 10/09/2014 1550   APPEARANCEUR Clear 08/22/2015 1105   LABSPEC 1.025 10/09/2014 1550   LABSPEC 1.026 03/12/2013 0013   PHURINE 5.0 10/09/2014 1550   GLUCOSEU 3+ (A) 08/22/2015 1105   GLUCOSEU >=500 03/12/2013 0013   HGBUR NEGATIVE 10/09/2014 1550   BILIRUBINUR Negative 08/22/2015 1105   BILIRUBINUR Negative 03/12/2013 0013   KETONESUR TRACE (A) 10/09/2014 1550    PROTEINUR Negative 08/22/2015 1105   PROTEINUR NEGATIVE 10/09/2014 1550   NITRITE Negative 08/22/2015 1105   NITRITE NEGATIVE 10/09/2014 1550   LEUKOCYTESUR Negative 08/22/2015 1105   LEUKOCYTESUR Negative 03/12/2013 0013    Pertinent Imaging: none  Assessment & Plan:    1. Hypogonadism in male -We discussed the various treatment options including injections, gels, testopel and clomiphene. We disucssed the cardiac risks associated with TRT and the patient wishes to proceed He wants to proceed with clomiphene. RTC 2 months with testosteorne labs   No Follow-up on file.  Nicolette Bang, MD  [facility] [address] [place], [place] [address] [phone]

## 2016-01-30 ENCOUNTER — Ambulatory Visit: Payer: BLUE CROSS/BLUE SHIELD

## 2016-04-10 ENCOUNTER — Other Ambulatory Visit: Payer: Self-pay

## 2016-06-04 ENCOUNTER — Ambulatory Visit: Payer: Medicare Other | Admitting: Dietician

## 2016-06-05 ENCOUNTER — Encounter: Payer: Self-pay | Admitting: Dietician

## 2016-06-05 ENCOUNTER — Encounter: Payer: Medicare HMO | Attending: Internal Medicine | Admitting: Dietician

## 2016-06-05 VITALS — Ht 70.0 in | Wt 235.7 lb

## 2016-06-05 DIAGNOSIS — E119 Type 2 diabetes mellitus without complications: Secondary | ICD-10-CM | POA: Diagnosis not present

## 2016-06-05 DIAGNOSIS — Z713 Dietary counseling and surveillance: Secondary | ICD-10-CM | POA: Insufficient documentation

## 2016-06-05 NOTE — Progress Notes (Signed)
Medical Nutrition Therapy: Visit start time: 0900  end time: 1000  Assessment:  Diagnosis: diabetes Past medical history: HTN, HLD, sleep apnea Psychosocial issues/ stress concerns: none Preferred learning method:  . No preference indicated  Current weight: 235.7lbs with shoes and jacket Height: 5'10" Medications, supplements: reconciled list in medical record  Progress and evaluation: Patient reports about 10-year history of diabetes, has not participated in any diabetes education in the past. He reports being laid off from his job in January of this year, and has not been active since then. He is not testing BGs at home, and states his eating habits have worsened since he stopped working.    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 Dining out frequency: 2-3 meals per week.  Breakfast: cereal or cheese toast, Healthy Balance juice (pomegranate), 2%milk Snack: none Lunch: hamburger, cheese sandwich, banana sandwich Snack: nabs-- peanut butter crackers or oatmeal cookie (Little Debbie) Supper: wife cooks, or eats out; 4/24 pork chop, black-eyed peas, rice; chicken often, likes leafy greens, usually with rice or potato; sometimes cornbread.  Snack: none Beverages: diet drinks 1-2 per day, mostly water -- pt states not enough, trying to increase.   Nutrition Care Education: Topics covered: diabetes Basic nutrition: basic food groups, appropriate nutrient balance, appropriate meal and snack schedule, general nutrition guidelines    Diabetes:  Goal for HbA1C and importance of controlling to avoid complications, appropriate meal and snack schedule, appropriate carb intake and balance; role of protein and fiber in BG control and importance of controlling fat intake. Used plate method and food models to instruct on basic meal planning, and wrote individualized menus.  Other lifestyle changes:  benefits  of regular exercise  Nutritional Diagnosis:  [place]-2.2 Altered nutrition-related laboratory As related to diabetes.  As evidenced by HbA1C 8.7%.  Intervention: Instruction as noted above.   Set goals with input from patient.    Patient declined follow-up at this time, he will schedule later if needed.  Education Materials given:  . General diet guidelines for Diabetes . Plate Planner with food lists . Menus individualized for patient's preferences . Goals/ instructions  Learner/ who was taught:  . Patient   Level of understanding: Marland Kitchen Verbalizes/ demonstrates competency  Demonstrated degree of understanding via:   Teach back Learning barriers: . None  Willingness to learn/ readiness for change: . Acceptance, ready for change  Monitoring and Evaluation:  Dietary intake, exercise, BG control, and body weight      follow up: prn

## 2016-06-05 NOTE — Patient Instructions (Signed)
   Decrease portions of starchy foods with meals to 1 cup (fist-size) or less -- gradually work towards that portion if needed.   Include generous portions (1 cup or more) of low-carb/ "free" vegetables with meals. Include salad or raw veggies with sandwich at lunch.   Make sure to have a protein with each meal  -- add nuts to cereal at breakfast, add peanut butter to your banana sandwiches.   Stay active, exercise will decrease blood sugar.

## 2019-11-30 ENCOUNTER — Ambulatory Visit
Admission: RE | Admit: 2019-11-30 | Discharge: 2019-11-30 | Disposition: A | Discharge status: Home / Self Care | Payer: Medicare HMO | Source: Ambulatory Visit | Attending: Physician Assistant | Admitting: Physician Assistant

## 2019-11-30 ENCOUNTER — Other Ambulatory Visit: Payer: Self-pay | Admitting: Physician Assistant

## 2019-11-30 ENCOUNTER — Other Ambulatory Visit: Payer: Self-pay

## 2019-11-30 DIAGNOSIS — M79604 Pain in right leg: Secondary | ICD-10-CM | POA: Diagnosis not present

## 2021-12-18 IMAGING — US US EXTREM LOW VENOUS*R*
1 series · 14 of 24 positions shown · non-contrast
Comparison: None

CLINICAL DATA: RIGHT leg pain for 1 day

EXAM:
RIGHT LOWER EXTREMITY VENOUS DOPPLER ULTRASOUND
TECHNIQUE: Gray-scale sonography with compression, as well as color and duplex
ultrasound, were performed to evaluate the deep venous system(s)
from the level of the common femoral vein through the popliteal and
proximal calf veins.

[Series 1: lev · 14 of 37 slices shown]
[im 1/37]
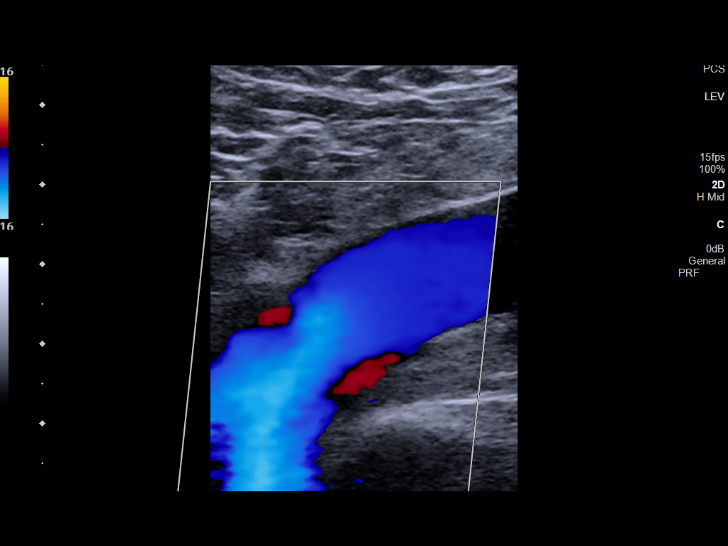
[im 4/37]
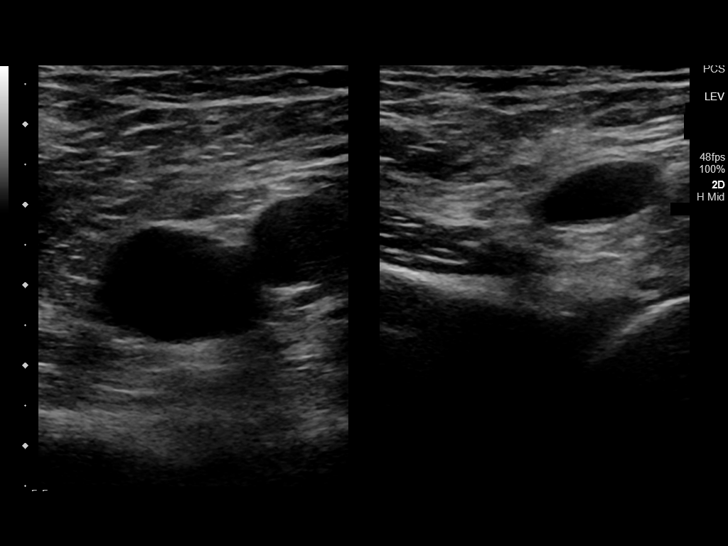
[im 7/37]
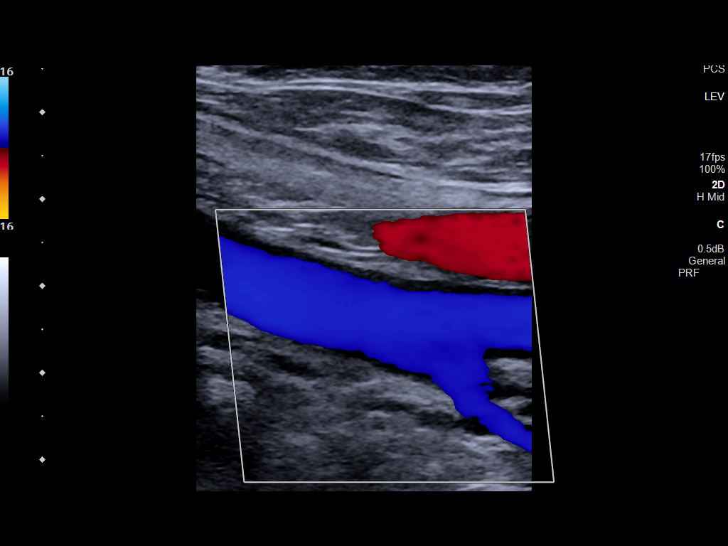
[im 10/37]
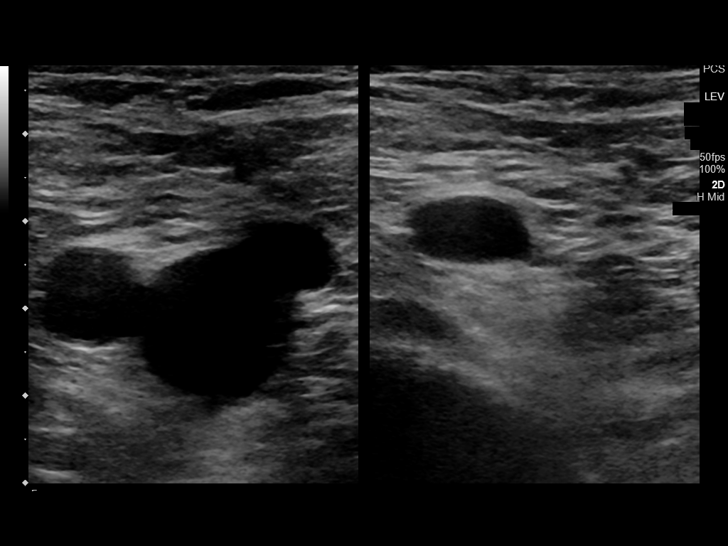
[im 11/37]
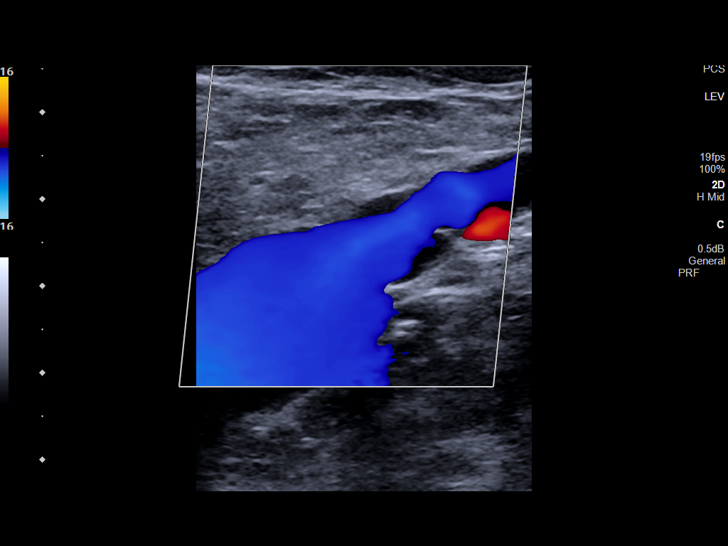
[im 15/37]
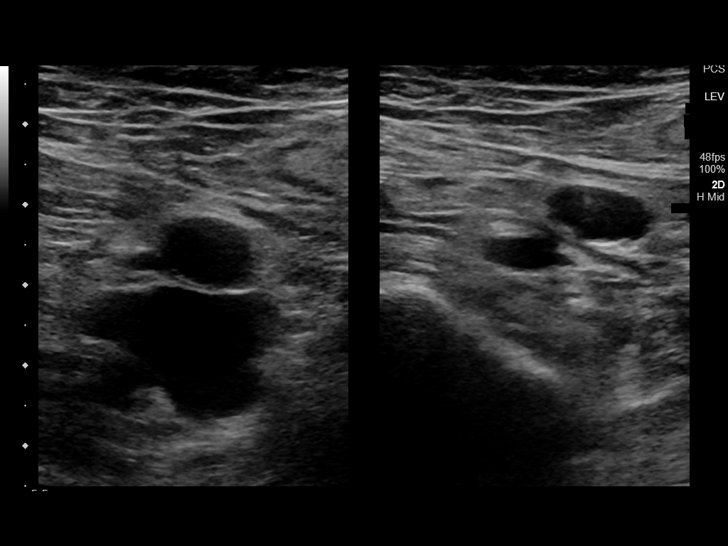
[im 18/37]
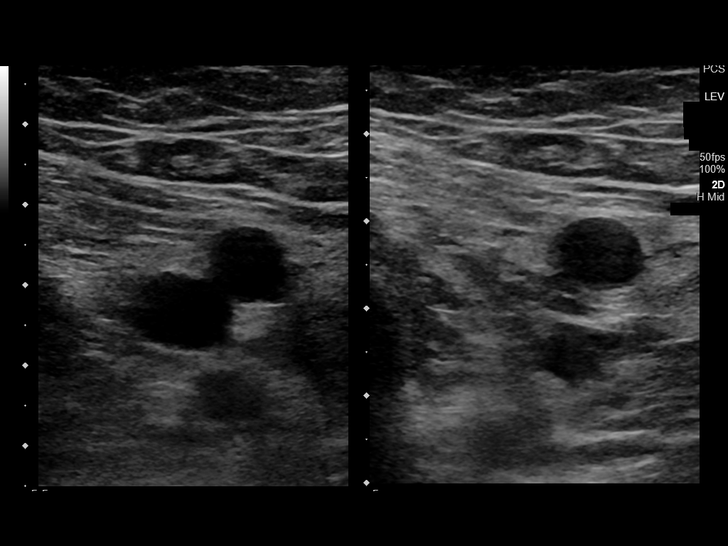
[im 19/37]
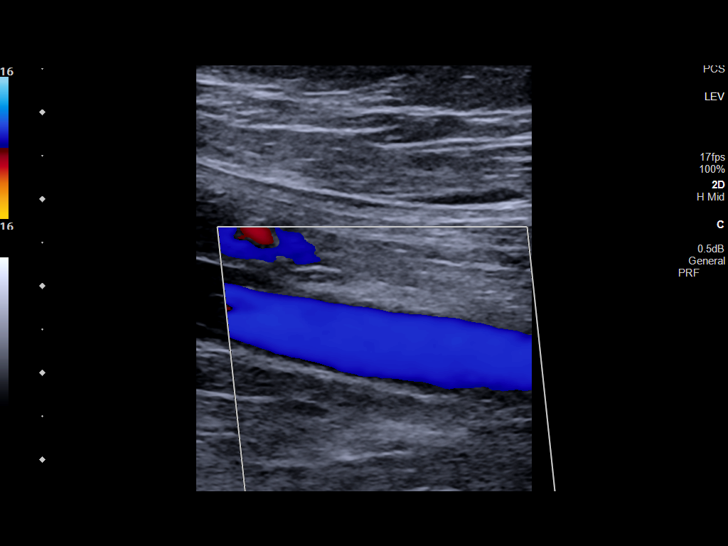
[im 22/37]
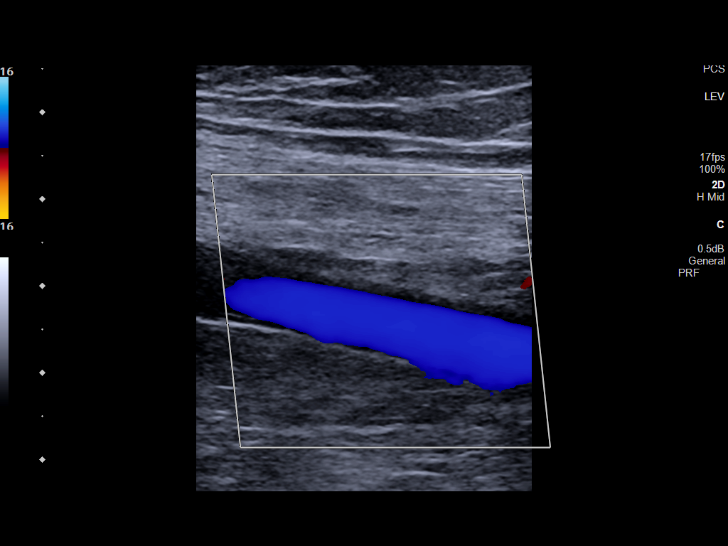
[im 26/37]
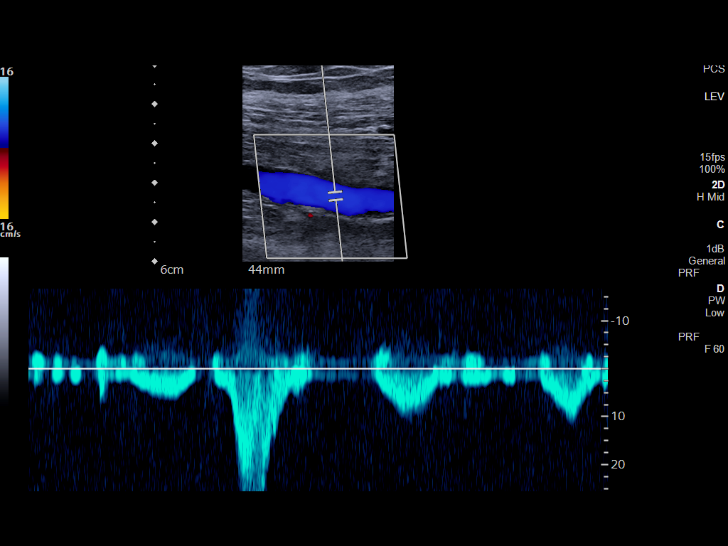
[im 29/37]
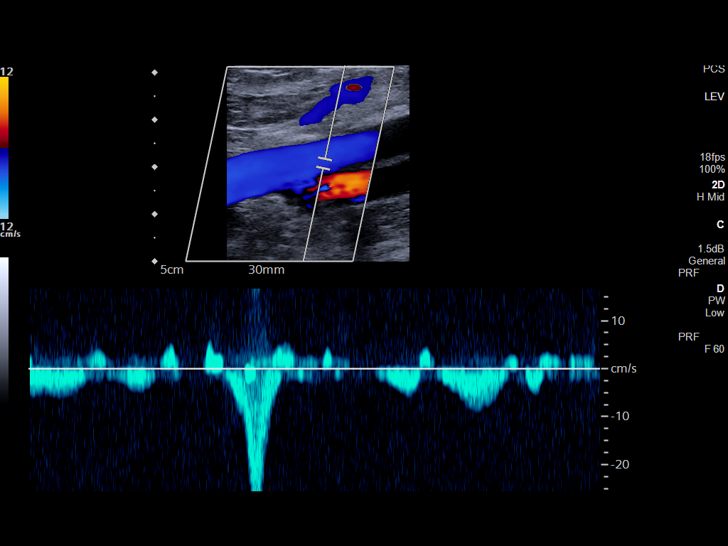
[im 30/37]
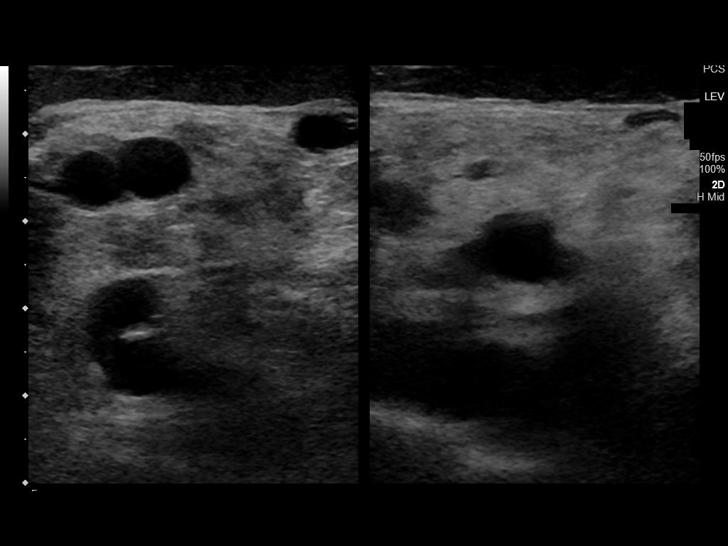
[im 33/37]
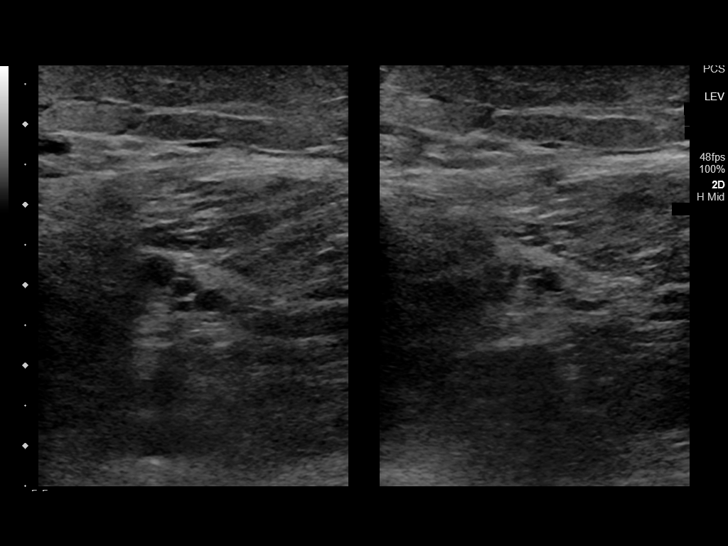
[im 37/37]
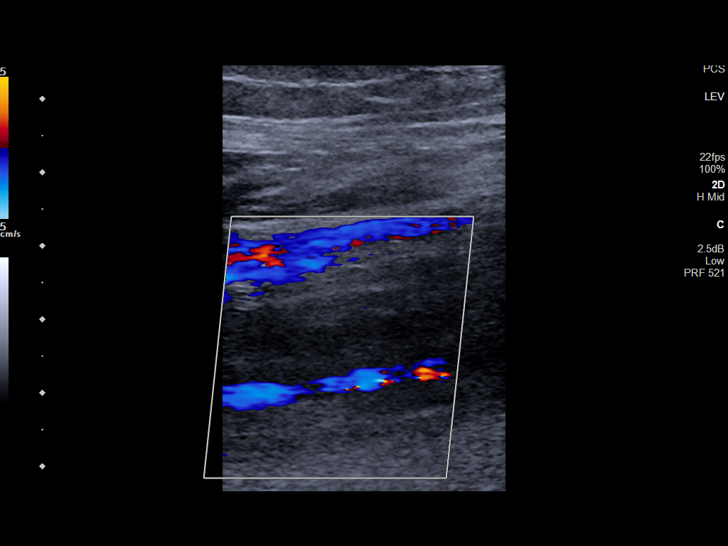

[14 of 24 positions shown; findings below may reference images not displayed]

FINDINGS: VENOUS

Normal compressibility of the common femoral, superficial femoral,
and popliteal veins, as well as the visualized calf veins.
Visualized portions of profunda femoral vein and great saphenous
vein unremarkable. No filling defects to suggest DVT on grayscale or
color Doppler imaging. Doppler waveforms show normal direction of
venous flow, normal respiratory plasticity and response to
augmentation.

Limited views of the contralateral common femoral vein are
unremarkable.

OTHER

None.

Limitations: none
IMPRESSION: No evidence of deep venous thrombosis in the RIGHT lower extremity.

## 2022-01-12 ENCOUNTER — Ambulatory Visit
Admission: EM | Admit: 2022-01-12 | Discharge: 2022-01-12 | Disposition: A | Discharge status: Home / Self Care | Payer: Medicare HMO | Attending: Internal Medicine | Admitting: Internal Medicine

## 2022-01-12 ENCOUNTER — Encounter: Payer: Self-pay | Admitting: Emergency Medicine

## 2022-01-12 DIAGNOSIS — N401 Enlarged prostate with lower urinary tract symptoms: Secondary | ICD-10-CM | POA: Diagnosis not present

## 2022-01-12 DIAGNOSIS — R300 Dysuria: Secondary | ICD-10-CM | POA: Diagnosis not present

## 2022-01-12 DIAGNOSIS — R3911 Hesitancy of micturition: Secondary | ICD-10-CM | POA: Diagnosis not present

## 2022-01-12 LAB — URINALYSIS, ROUTINE W REFLEX MICROSCOPIC
Bilirubin Urine: NEGATIVE
Glucose, UA: >1000 mg/dL — AB
Hgb urine dipstick: NEGATIVE
Leukocytes,Ua: NEGATIVE
Nitrite: NEGATIVE
Protein, ur: NEGATIVE mg/dL
Specific Gravity, Urine: 1.01 (ref 1.005–1.030)
pH: 5.5 (ref 5.0–8.0)

## 2022-01-12 NOTE — ED Provider Notes (Signed)
[facility]    CSN: [id number] Arrival date & time: 01/12/22  1415      History   Chief Complaint Chief Complaint  Patient presents with   Dysuria    HPI Andre Edwards is a 76 y.o. male with a history of BPH, HTN, HLD and DM2 presents to the UC today with complaint of burning with urination.  He reports his symptoms started this morning.  He has chronic frequency and hesitancy but denies urgency, dysuria, blood in his urine, penile discharge, penile swelling, testicular pain or swelling.  He denies lower abdominal pain, low back pain, fever, chills, nausea or vomiting.  He has not taken anything OTC for this.  He is on Flomax for his BPH.  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   Burn 06/29/2014   Diabetes mellitus, type 2 ([place]) 06/29/2014   Abnormal prostate specific antigen 06/29/2014   Allergy to environmental factors 06/29/2014   HLD (hyperlipidemia) 06/29/2014   BP (high blood pressure) 06/29/2014   Stasis, venous 06/29/2014    Past Surgical History:  Procedure Laterality Date   CHOLECYSTECTOMY     CHOLECYSTECTOMY N/A 07/04/2014   Procedure: LAPAROSCOPIC CHOLECYSTECTOMY;  Surgeon: Christene Lye, MD;  Location: [facility] ORS;  Service: General;  Laterality: N/A;   EYE SURGERY Bilateral 2015   PROSTATE SURGERY N/A        Home Medications    Prior to Admission medications   Medication Sig Start Date End Date Taking? Authorizing Provider  aspirin 81 MG chewable tablet Chew 81 mg by mouth daily.   Yes  [provider]  atorvastatin (LIPITOR) 10 MG tablet Take 10 mg by mouth daily.   Yes [provider]  diltiazem (DILACOR XR) 240 MG 24 hr capsule Take by mouth. 03/31/15  Yes [provider]  FARXIGA 10 MG TABS tablet TAKE 1 TABLET BY MOUTH EVERY DAY IN THE MORNING 10/30/21  Yes [provider]  glipiZIDE (GLUCOTROL) 10 MG tablet Take 10 mg by mouth daily before breakfast.   Yes [provider]  hydrochlorothiazide (HYDRODIURIL) 25 MG tablet Take 25 mg by mouth daily.   Yes [provider]  metFORMIN (GLUCOPHAGE) 1000 MG tablet Take 1,000 mg by mouth 2 (two) times daily with a meal.   Yes [provider]  tamsulosin (FLOMAX) 0.4 MG CAPS capsule TAKE 1 CAPSULE BY MOUTH TWICE A DAY 30 MINUTES AFTER BREAKFAST AND DINNER 12/07/21  Yes [provider]  traMADol (ULTRAM) 50 MG tablet TAKE 1 TABLET (50 MG TOTAL) BY MOUTH EVERY 6 (SIX) HOURS AS NEEDED FOR PAIN FOR UP TO 30 DOSES 01/10/22  Yes [provider]  canagliflozin (INVOKANA) 300 MG TABS tablet Take 300 mg by mouth daily after breakfast.  05/02/14   [provider]  cetirizine (ZYRTEC) 10 MG tablet Take 10 mg by mouth daily.    [provider]  Chromium-Cinnamon 50-500 MCG-MG CAPS Take 500 mg/kg/day by mouth daily.     [provider]  clomiPHENE (CLOMID) 50 MG tablet Take 0.5 tablets (25 mg total) by mouth daily. 12/05/15   McKenzie, Candee Furbish, MD  diltiazem (DILACOR XR) 240 MG 24 hr capsule TAKE 1 CAPSULE (240 MG TOTAL) BY MOUTH ONCE DAILY. 10/19/15   [provider]  docusate sodium (COLACE) 100 MG capsule Take 100 mg by mouth daily.     [provider]  magnesium oxide (MAG-OX) 400 MG tablet TAKE 1 TABLET (400 MG TOTAL) BY MOUTH ONCE DAILY. 05/08/16   [provider]  phenylephrine (SUDAFED PE) 10 MG TABS tablet Take 10 mg by mouth every 4 (four) hours as needed.    [provider]  vitamin B-12  (CYANOCOBALAMIN) 1000 MCG tablet Take 1,000 mcg by mouth daily.    [provider]    Family History History reviewed. No pertinent family history.  Social History Social History   Tobacco Use   Smoking status: Never   Smokeless tobacco: Current    Types: Chew  Vaping Use   Vaping Use: Never used  Substance Use Topics   Alcohol use: No    Alcohol/week: 0.0 standard drinks of alcohol   Drug use: No     Allergies   Amoxicillin-pot clavulanate, Erythromycin, Levofloxacin, Sulfa antibiotics, and Tetracycline   Review of Systems Review of Systems  Constitutional:  Negative for chills and fever.  Respiratory:  Negative for cough, chest tightness and shortness of breath.   Cardiovascular:  Negative for chest pain and palpitations.  Gastrointestinal:  Negative for abdominal pain, nausea and vomiting.  Genitourinary:  Positive for decreased urine volume, difficulty urinating and frequency. Negative for dysuria, flank pain, hematuria, penile discharge, penile pain, penile swelling, scrotal swelling, testicular pain and urgency.  Musculoskeletal:  Negative for back pain.     Physical Exam Triage Vital Signs ED Triage Vitals  Enc Vitals Group     BP 01/12/22 1501 123/79     Pulse Rate 01/12/22 1501 94     Resp 01/12/22 1501 15     Temp 01/12/22 1501 97.6 F (36.4 C)     Temp Source 01/12/22 1501 Oral     SpO2 01/12/22 1501 95 %     Weight 01/12/22 1457 235 lb 10.8 oz (106.9 kg)     Height 01/12/22 1457 '5\' 10"'$  (1.778 m)     Head Circumference --      Peak Flow --      Pain Score 01/12/22 1457 6     Pain Loc --      Pain Edu? --      Excl. in [place]? --    No data found.  Updated Vital Signs BP 123/79 (BP Location: Right Arm)   Pulse 94   Temp 97.6 F (36.4 C) (Oral)   Resp 15   Ht '5\' 10"'$  (1.778 m)   Wt 235 lb 10.8 oz (106.9 kg)   SpO2 95%   BMI 33.82 kg/m      Physical Exam Constitutional:      Appearance: Normal appearance.  Cardiovascular:      Rate and Rhythm: Normal rate and regular rhythm.  Pulmonary:     Effort: Pulmonary effort is normal.     Breath sounds: Normal breath sounds.  Abdominal:     General: Abdomen is flat.     Palpations: Abdomen is soft.     Tenderness: There is no abdominal tenderness. There is no right CVA tenderness or left CVA tenderness.  Skin:    General: Skin is warm and  dry.  Neurological:     General: No focal deficit present.     Mental Status: He is alert and oriented to person, place, and time.      UC Treatments / Results  Labs  Labs Reviewed  URINALYSIS, ROUTINE W REFLEX MICROSCOPIC - Abnormal; Notable for the following components:      Result Value   APPearance HAZY (*)    Glucose, UA >1,000 (*)    Ketones, ur TRACE (*)    All other components within normal limits  URINE CULTURE     Medications Ordered in UC Medications - No data to display  Initial Impression / Assessment and Plan / UC Course  I have reviewed the triage vital signs and the nursing notes.  Pertinent labs & imaging results that were available during my care of the patient were reviewed by me and considered in my medical decision making (see chart for details).    76 year old male with complaint of burning with urination, urinary frequency and hesitancy with history of BPH.  Symptoms x1 day.  DDx include acute cystitis without hematuria, acute prostatitis, interstitial cystitis.  Urinalysis normal.  We will send urine culture for further evaluation.  He is aware he will be called with only positive results.  He admits that he does not drink a lot of fluids, advised him to increase his water intake and avoid caffeine.  Continue Flomax.  He declines Rx for Pyridium for symptom management at this time.  Advised him to try cranberry tablets.  Advised on follow-up with his PCP if symptoms persist or worsen.  Final Clinical Impressions(s) / UC Diagnoses   Final diagnoses:  Burning with urination  Benign prostatic  hyperplasia with urinary hesitancy     Discharge Instructions      You were seen today for burning with urination.  Your urinalysis did not show any evidence of infection.  We are sending a urine culture but will only call you if the result is positive.  This could be related to your enlarged prostate.  We would recommend that you continue your Flomax, increase water intake and try cranberry OTC.     ED Prescriptions   None    PDMP not reviewed this encounter.   Jearld Fenton, NP 01/12/22 1525

## 2022-01-12 NOTE — ED Triage Notes (Signed)
Patient c/o burning and difficulty urinating that started this morning.  Patient denies N/V.  Patient denies fevers.

## 2022-01-12 NOTE — Discharge Instructions (Addendum)
You were seen today for burning with urination.  Your urinalysis did not show any evidence of infection.  We are sending a urine culture but will only call you if the result is positive.  This could be related to your enlarged prostate.  We would recommend that you continue your Flomax, increase water intake and try cranberry OTC.

## 2022-01-14 LAB — URINE CULTURE: Culture: NO GROWTH

## 2022-01-15 ENCOUNTER — Other Ambulatory Visit: Payer: Self-pay

## 2022-01-15 ENCOUNTER — Encounter: Payer: Self-pay | Admitting: Emergency Medicine

## 2022-01-15 ENCOUNTER — Emergency Department
Admission: EM | Admit: 2022-01-15 | Discharge: 2022-01-15 | Disposition: A | Discharge status: Home / Self Care | Payer: Medicare HMO | Attending: Emergency Medicine | Admitting: Emergency Medicine

## 2022-01-15 DIAGNOSIS — I10 Essential (primary) hypertension: Secondary | ICD-10-CM | POA: Insufficient documentation

## 2022-01-15 DIAGNOSIS — R339 Retention of urine, unspecified: Secondary | ICD-10-CM | POA: Insufficient documentation

## 2022-01-15 DIAGNOSIS — E119 Type 2 diabetes mellitus without complications: Secondary | ICD-10-CM | POA: Diagnosis not present

## 2022-01-15 LAB — URINALYSIS, ROUTINE W REFLEX MICROSCOPIC
Bacteria, UA: NONE SEEN
Bilirubin Urine: NEGATIVE
Glucose, UA: >=500 mg/dL — AB
Ketones, ur: 5 mg/dL — AB
Leukocytes,Ua: NEGATIVE
Nitrite: NEGATIVE
Protein, ur: NEGATIVE mg/dL
Specific Gravity, Urine: 1.014 (ref 1.005–1.030)
Squamous Epithelial / HPF: NONE SEEN (ref 0–5)
pH: 6 (ref 5.0–8.0)

## 2022-01-15 NOTE — ED Triage Notes (Signed)
[place] on flomax.

## 2022-01-15 NOTE — ED Provider Notes (Signed)
[facility] Provider Note    Event Date/Time   First MD Initiated Contact with Patient 01/15/22 1637     (approximate)   History   Urinary Retention   HPI  Andre Edwards is a 76 y.o. male past medical history of diabetes BPH hypertension hyperlipidemia presents with urinary retention.  Patient urinated this morning but has not been able to go since then.  He endorses significant suprapubic fullness and pain.  Denies fevers chills nausea vomiting or back pain.  Patient has had some burning with urination and difficulty peeing over the last several days.  Was seen in the ED 3 days ago had urinalysis that was clean.  Urine culture not growing anything.  Does have a history of urinary tension and required Foley catheter after surgery.     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   Burn 06/29/2014   Diabetes mellitus, type 2 ([place]) 06/29/2014   Abnormal prostate specific antigen 06/29/2014   Allergy to environmental factors 06/29/2014   HLD (hyperlipidemia) 06/29/2014   BP (high blood pressure) 06/29/2014   Stasis, venous 06/29/2014     Physical Exam  Triage Vital Signs: ED Triage Vitals  Enc Vitals Group     BP 01/15/22 1633 113/88     Pulse Rate 01/15/22 1633 (!) 101     Resp 01/15/22 1633 18     Temp 01/15/22 1633 97.9 F (36.6 C)     Temp Source 01/15/22 1633 Oral     SpO2 01/15/22 1633 100 %     Weight 01/15/22 1635 235 lb 10.8 oz (106.9 kg)     Height 01/15/22 1635 '5\' 10"'$  (1.778 m)     Head Circumference  --      Peak Flow --      Pain Score 01/15/22 1635 10     Pain Loc --      Pain Edu? --      Excl. in [place]? --     Most recent vital signs: Vitals:   01/15/22 1633  BP: 113/88  Pulse: (!) 101  Resp: 18  Temp: 97.9 F (36.6 C)  SpO2: 100%     General: Awake, patient looks uncomfortable CV:  Good peripheral perfusion.  Resp:  Normal effort.  Abd:  No distention.  Suprapubic fullness Neuro:             Awake, Alert, Oriented x 3  Other:  Normal external GU exam   ED Results / Procedures / Treatments  Labs (all labs ordered are listed, but only abnormal results are displayed) Labs Reviewed  URINALYSIS, ROUTINE W REFLEX MICROSCOPIC     EKG     RADIOLOGY    PROCEDURES:  Critical Care performed: No  Procedures    MEDICATIONS ORDERED IN ED: Medications - No data to display   IMPRESSION / MDM / [place] / ED COURSE  I reviewed the triage vital signs and the nursing notes.  Patient's presentation is most consistent with acute complicated illness / injury requiring diagnostic workup.  Differential diagnosis includes, but is not limited to, urinary retention secondary to bladder outlet obstruction, UTI, prostatic malignancy, medication side effect  Patient is a 76 year old male presents with urinary retention.  He has about a liter of urine in his bladder on bladder scan.  Foley catheter placed with about a liter of clear urine out.  Patient feels much improved afterward.  He has had some burning with urination and difficulty peeing over the last several days but was able to go until this morning.  Was seen in the ED just 3 days ago urine culture from that time is not growing anything.  Patient still had fevers nausea vomiting or back pain.  Does have history of enlarged prostate and urinary tension in the past.  He is already on Flomax.  Given recent UA is clean do not feel need to repeat.  Plan to discharge with Foley  catheter.  Recommended urology follow-up for trial of void in about 1 week.       FINAL CLINICAL IMPRESSION(S) / ED DIAGNOSES   Final diagnoses:  Urinary retention     Rx / DC Orders   ED Discharge Orders     None        Note:  This document was prepared using Dragon voice recognition software and may include unintentional dictation errors.   Rada Hay, MD 01/15/22 [phone]

## 2022-01-15 NOTE — ED Notes (Signed)
Bladder scan [phone]

## 2022-01-15 NOTE — Discharge Instructions (Signed)
Please follow-up with urology in about 1 week.  Continue to take the Flomax.

## 2022-01-24 ENCOUNTER — Ambulatory Visit: Payer: Medicare HMO | Admitting: Urology

## 2022-01-24 ENCOUNTER — Ambulatory Visit: Payer: BLUE CROSS/BLUE SHIELD | Admitting: Urology

## 2022-01-24 VITALS — BP 124/79 | HR 94 | Ht 70.0 in | Wt 202.1 lb

## 2022-01-24 DIAGNOSIS — R339 Retention of urine, unspecified: Secondary | ICD-10-CM | POA: Diagnosis not present

## 2022-01-24 DIAGNOSIS — N401 Enlarged prostate with lower urinary tract symptoms: Secondary | ICD-10-CM

## 2022-01-24 DIAGNOSIS — Z87898 Personal history of other specified conditions: Secondary | ICD-10-CM

## 2022-01-24 LAB — BLADDER SCAN AMB NON-IMAGING: Scan Result: 71

## 2022-01-24 MED ORDER — FINASTERIDE 5 MG PO TABS: 5.0000 mg | ORAL_TABLET | Freq: Every day | ORAL | 12 refills | Status: DC

## 2022-01-24 NOTE — Patient Instructions (Signed)
Start taking Finasteride 1 tablet daily.   Cystoscopy Cystoscopy is a procedure that is used to help diagnose and sometimes treat conditions that affect the lower urinary tract. The lower urinary tract includes the bladder and the urethra. The urethra is the tube that drains urine from the bladder. Cystoscopy is done using a thin, tube-shaped instrument with a light and camera at the end (cystoscope). The cystoscope may be hard or flexible, depending on the goal of the procedure. The cystoscope is inserted through the urethra, into the bladder. Cystoscopy may be recommended if you have: Urinary tract infections that keep coming back. Blood in the urine (hematuria). An inability to control when you urinate (urinary incontinence) or an overactive bladder. Unusual cells found in a urine sample. A blockage in the urethra, such as a urinary stone. Painful urination. An abnormality in the bladder found during an intravenous pyelogram (IVP) or CT scan. What are the risks? Generally, this is a safe procedure. However, problems may occur, including: Infection. Bleeding.  What happens during the procedure?  You will be given one or more of the following: A medicine to numb the area (local anesthetic). The area around the opening of your urethra will be cleaned. The cystoscope will be passed through your urethra into your bladder. Germ-free (sterile) fluid will flow through the cystoscope to fill your bladder. The fluid will stretch your bladder so that your health care provider can clearly examine your bladder walls. Your doctor will look at the urethra and bladder. The cystoscope will be removed The procedure may vary among health care providers  What can I expect after the procedure? After the procedure, it is common to have: Some soreness or pain in your urethra. Urinary symptoms. These include: Mild pain or burning when you urinate. Pain should stop within a few minutes after you urinate.  This may last for up to a few days after the procedure. A small amount of blood in your urine for several days. Feeling like you need to urinate but producing only a small amount of urine. Follow these instructions at home: General instructions Return to your normal activities as told by your health care provider.  Drink plenty of fluids after the procedure. Keep all follow-up visits as told by your health care provider. This is important. Contact a health care provider if you: Have pain that gets worse or does not get better with medicine, especially pain when you urinate lasting longer than 72 hours after the procedure. Have trouble urinating. Get help right away if you: Have blood clots in your urine. Have a fever or chills. Are unable to urinate. Summary Cystoscopy is a procedure that is used to help diagnose and sometimes treat conditions that affect the lower urinary tract. Cystoscopy is done using a thin, tube-shaped instrument with a light and camera at the end. After the procedure, it is common to have some soreness or pain in your urethra. It is normal to have blood in your urine after the procedure.  If you were prescribed an antibiotic medicine, take it as told by your health care provider.  This information is not intended to replace advice given to you by your health care provider. Make sure you discuss any questions you have with your health care provider. Document Revised: 01/20/2018 Document Reviewed: 01/20/2018 Elsevier Patient Education  [place].    Transrectal Ultrasound  A transrectal ultrasound is a procedure that uses sound waves to create images of the prostate gland and nearby  tissues. For this procedure, an ultrasound probe is placed in the rectum. The probe sends sound waves through the wall of the rectum into the prostate gland. The prostate is a walnut-sized gland that is located below the bladder and in front of the rectum. The images show the  size and shape of the prostate gland and nearby structures. You may need this test if you have: Trouble urinating. Trouble getting your partner pregnant (infertility). An abnormal result from a prostate screening exam. Tell a health care provider about: Any allergies you have. All medicines you are taking, including vitamins, herbs, eye drops, creams, and over-the-counter medicines. Any bleeding problems you have. Any surgeries you have had. Any medical conditions you have. Any prostate infections you have had. What are the risks? Generally, this is a safe procedure. However, problems may occur, including: Discomfort during the procedure. Blood in your urine or sperm after the procedure. This may occur if a sample of tissue is taken to look at under a microscope (biopsy) during the procedure. What happens before the procedure? Your health care provider may instruct you to use an enema 1-4 hours before the procedure. Follow instructions from your health care provider about how to do the enema. Ask your health care provider about: Changing or stopping your regular medicines. This is especially important if you are taking diabetes medicines or blood thinners. Taking medicines such as aspirin and ibuprofen. These medicines can thin your blood. Do not take these medicines unless your health care provider tells you to take them. Taking over-the-counter medicines, vitamins, herbs, and supplements. What happens during the procedure? You will be asked to lie down on your left side on an exam table. You will bend your knees toward your chest. Gel will be put on a small probe that is about the width of a finger. The probe will be gently inserted into your rectum. You may have a feeling of fullness but should not feel pain. The probe will send signals to a computer that will create images. These will be displayed on a monitor that looks like a small television screen. The technician will slightly  rotate the probe throughout the procedure. While rotating the probe, he or she will view and capture images of the prostate gland and the surrounding structures from different angles. Your health care provider may take a biopsy sample of prostate tissue during the procedure. The images captured from the ultrasound will help guide the needle that is used to remove a sample of tissue. The sample will be sent to a lab for testing. The probe will be removed. The procedure may vary among health care providers and hospitals. What can I expect after the procedure? It is up to you to get the results of your procedure. Ask your health care provider, or the department that is doing the procedure, when your results will be ready. Keep all follow-up visits. This is important. Summary A transrectal ultrasound is a procedure that uses sound waves to create images of the prostate gland and nearby tissues. The images show the size and shape of the prostate gland and nearby structures. Before the procedure, ask your health care provider about changing or stopping your regular medicines. This is especially important if you are taking diabetes medicines or blood thinners. This information is not intended to replace advice given to you by your health care provider. Make sure you discuss any questions you have with your health care provider. Document Revised: 10/11/2020 Document Reviewed: 07/24/2020 Elsevier  Patient Education  2023 Elsevier Inc.  

## 2022-01-24 NOTE — Progress Notes (Signed)
Fill and Pull Catheter Removal  Patient is present today for a catheter removal.  Patient was cleaned and prepped in a sterile fashion 350 ml of sterile water/ saline was instilled into the bladder when the patient felt the urge to urinate. Patient was also given water by mouth 237 ml x 3. 8.5 ml of water was then drained from the balloon.  A 16 FR foley cath was removed from the bladder no complications were noted .  Patient as then given some time to void on their own.  Patient can void 208 ml on their own after some time.  Patient tolerated well.  Performed by: Markus Jarvis and Shannon L  Follow up/ Additional notes: TRUS/CYSTO in January, start Finasteride.

## 2022-01-24 NOTE — Progress Notes (Signed)
I,Andre Edwards,acting as a scribe for [name], MD.,have documented all relevant documentation on the behalf of [name], MD,as directed by  [name], MD while in the presence of [name], MD.   01/24/22 10:54 AM   Andre Edwards November 17, 1945 [id number]  Referring provider: Idelle Crouch, MD [facility] [place],  [place] [address]  Chief Complaint  Patient presents with   Establish Care   Urinary Retention    Fill and pull trial    HPI: 76 year-old male with a personal history of BPH, urinary retention after lap-chole in 2016, elevated PSA and a negative biopsy in 2013, KTP laser ablation of the prostate several years ago who presents for urinary retention.   His is currently on Flomax for BPH.  He presented to the urgent care 01/12/22 for complaints of frequency and hesitancy with negative urinalysis. He also presented to the ED 01/15/22 with similar symptoms along with suprapubic fullness found to be in retention. He had a liter of urine out on fully catheter placement.  His most recent PSA was 6.44 on 01/10/22.    PMH: Past Medical History:  Diagnosis Date   Cataract    Diabetes mellitus without complication ([place])    Enlarged prostate    Hyperlipidemia    Hypertension    Pneumonia 02/2013   Sleep apnea     Surgical History: Past Surgical History:  Procedure Laterality Date   CHOLECYSTECTOMY     CHOLECYSTECTOMY N/A 07/04/2014   Procedure: LAPAROSCOPIC CHOLECYSTECTOMY;  Surgeon: Christene Lye, MD;  Location: [facility] ORS;  Service: General;  Laterality: N/A;   EYE SURGERY Bilateral 2015   PROSTATE SURGERY N/A     Home Medications:  Allergies as of 01/24/2022       Reactions   Amoxicillin-pot Clavulanate Other (See Comments)   Erythromycin Itching, Swelling   Levofloxacin Itching   tongue   Sulfa Antibiotics    Other reaction(s): Unknown   Tetracycline    Other reaction(s): Unknown         Medication List        Accurate as of January 24, 2022 10:54 AM. If you have any questions, ask your nurse or doctor.          aspirin 81 MG chewable tablet Chew 81 mg by mouth daily.   atorvastatin 10 MG tablet Commonly known as: LIPITOR Take 10 mg by mouth daily.   canagliflozin 300 MG Tabs tablet Commonly known as: INVOKANA Take 300 mg by mouth daily after breakfast.   cetirizine 10 MG tablet Commonly known as: ZYRTEC Take 10 mg by mouth daily.   Chromium-Cinnamon 50-500 MCG-MG Caps Take 500 mg/kg/day by mouth daily.   clomiPHENE 50 MG tablet Commonly known as: CLOMID Take 0.5 tablets (25 mg total) by mouth daily.   cyanocobalamin 1000 MCG tablet Commonly known as: VITAMIN B12 Take 1,000 mcg by mouth daily.   diltiazem 240 MG 24 hr capsule Commonly known as: DILACOR XR TAKE 1 CAPSULE (240 MG TOTAL) BY MOUTH ONCE DAILY.   docusate sodium 100 MG capsule Commonly known as: COLACE Take 100 mg by mouth daily.   Farxiga 10 MG Tabs tablet Generic drug: dapagliflozin propanediol TAKE 1 TABLET BY MOUTH EVERY DAY IN THE MORNING   finasteride 5 MG tablet Commonly known as: PROSCAR Take 1 tablet (5 mg total) by mouth daily. Started by: [name], MD   glipiZIDE 10 MG tablet Commonly known as: GLUCOTROL Take 10 mg  by mouth 2 (two) times daily before a meal.   hydrochlorothiazide 25 MG tablet Commonly known as: HYDRODIURIL Take 25 mg by mouth daily.   magnesium oxide 400 MG tablet Commonly known as: MAG-OX TAKE 1 TABLET (400 MG TOTAL) BY MOUTH ONCE DAILY.   metFORMIN 1000 MG tablet Commonly known as: GLUCOPHAGE Take 1,000 mg by mouth 2 (two) times daily with a meal.   phenylephrine 10 MG Tabs tablet Commonly known as: SUDAFED PE Take 10 mg by mouth every 4 (four) hours as needed.   tamsulosin 0.4 MG Caps capsule Commonly known as: FLOMAX TAKE 1 CAPSULE BY MOUTH TWICE A DAY 30 MINUTES AFTER BREAKFAST AND DINNER   traMADol 50 MG tablet Commonly  known as: ULTRAM TAKE 1 TABLET (50 MG TOTAL) BY MOUTH EVERY 6 (SIX) HOURS AS NEEDED FOR PAIN FOR UP TO 30 DOSES        Allergies:  Allergies  Allergen Reactions   Amoxicillin-Pot Clavulanate Other (See Comments)   Erythromycin Itching and Swelling   Levofloxacin Itching    tongue   Sulfa Antibiotics     Other reaction(s): Unknown   Tetracycline     Other reaction(s): Unknown    Social History:  reports that he has never smoked. His smokeless tobacco use includes chew. He reports that he does not drink alcohol and does not use drugs.   Physical Exam: BP 124/79   Pulse 94   Ht '5\' 10"'$  (1.778 m)   Wt 202 lb 2 oz (91.7 kg)   BMI 29.00 kg/m   Constitutional:  Alert and oriented, No acute distress. HEENT: [place] AT, moist mucus membranes.  Trachea midline, no masses. Neurologic: Grossly intact, no focal deficits, moving all 4 extremities. Psychiatric: Normal mood and affect.  Laboratory Data: Lab Results  Component Value Date   WBC 6.9 10/09/2014   HGB 11.4 (L) 10/09/2014   HCT 35.8 (L) 10/09/2014   MCV 80.4 10/09/2014   PLT 195 10/09/2014    Lab Results  Component Value Date   CREATININE 0.93 10/09/2014    Lab Results  Component Value Date   TESTOSTERONE 267 09/25/2015    Urinalysis    Component Value Date/Time   COLORURINE YELLOW (A) 01/15/2022 1648   APPEARANCEUR CLEAR (A) 01/15/2022 1648   APPEARANCEUR Clear 08/22/2015 1105   LABSPEC 1.014 01/15/2022 1648   LABSPEC 1.026 03/12/2013 0013   PHURINE 6.0 01/15/2022 1648   GLUCOSEU >=500 (A) 01/15/2022 1648   GLUCOSEU >=500 03/12/2013 0013   HGBUR MODERATE (A) 01/15/2022 1648   BILIRUBINUR NEGATIVE 01/15/2022 1648   BILIRUBINUR Negative 08/22/2015 1105   BILIRUBINUR Negative 03/12/2013 0013   KETONESUR 5 (A) 01/15/2022 1648   PROTEINUR NEGATIVE 01/15/2022 1648   NITRITE NEGATIVE 01/15/2022 1648   LEUKOCYTESUR NEGATIVE 01/15/2022 1648   LEUKOCYTESUR Negative 03/12/2013 0013    Lab Results  Component  Value Date   LABMICR See below: 08/22/2015   WBCUA 0-5 08/22/2015   RBCUA None seen 08/22/2015   LABEPIT None seen 08/22/2015   BACTERIA NONE SEEN 01/15/2022    Assessment & Plan:    History of elevated PSA Stably elevated over many years. He had a negative biopsy in 2013. Likely appropriate for his prostate volume, will continue to monitor.   DRE showed normal sphincter tone, very large prostate over 60 grams, no nodules  BPH with urinary retention - Continue Flomax - Add Finasteride 5 mg daily to help shrink the prostate over time - I did recommend an office cystoscopy to  rule out obstruction. We discussed alternatives including TURP, PVP and holmium laser enucleation of the prostate for large glands.  Minimally invasive options of UroLift and water vapor ablation were discussed. Differences between the surgical procedures were discussed as well as the risks and benefits of each. Risk of bleeding, infection, damage surrounding structures, injury to the bladder/ urethral, bladder neck contracture, ureteral stricture, retrograde ejaculation, stress/ urge incontinence, exacerbation of irritative voiding symptoms were all discussed in detail.   No follow-ups on file.   Why [address] [place], [place] [address] [phone]

## 2022-01-25 ENCOUNTER — Telehealth: Payer: Self-pay | Admitting: Urology

## 2022-01-25 ENCOUNTER — Emergency Department
Admission: EM | Admit: 2022-01-25 | Discharge: 2022-01-25 | Disposition: A | Discharge status: Home / Self Care | Payer: Medicare HMO | Attending: Emergency Medicine | Admitting: Emergency Medicine

## 2022-01-25 ENCOUNTER — Other Ambulatory Visit: Payer: Self-pay

## 2022-01-25 DIAGNOSIS — R339 Retention of urine, unspecified: Secondary | ICD-10-CM | POA: Insufficient documentation

## 2022-01-25 DIAGNOSIS — I10 Essential (primary) hypertension: Secondary | ICD-10-CM | POA: Diagnosis not present

## 2022-01-25 DIAGNOSIS — Z79899 Other long term (current) drug therapy: Secondary | ICD-10-CM | POA: Diagnosis not present

## 2022-01-25 DIAGNOSIS — Z7982 Long term (current) use of aspirin: Secondary | ICD-10-CM | POA: Insufficient documentation

## 2022-01-25 DIAGNOSIS — E119 Type 2 diabetes mellitus without complications: Secondary | ICD-10-CM | POA: Insufficient documentation

## 2022-01-25 DIAGNOSIS — Z7984 Long term (current) use of oral hypoglycemic drugs: Secondary | ICD-10-CM | POA: Diagnosis not present

## 2022-01-25 LAB — URINALYSIS, ROUTINE W REFLEX MICROSCOPIC
Bacteria, UA: NONE SEEN
Bilirubin Urine: NEGATIVE
Glucose, UA: >=500 mg/dL — AB
Ketones, ur: NEGATIVE mg/dL
Leukocytes,Ua: NEGATIVE
Nitrite: NEGATIVE
Protein, ur: NEGATIVE mg/dL
Specific Gravity, Urine: 1.009 (ref 1.005–1.030)
Squamous Epithelial / HPF: NONE SEEN (ref 0–5)
pH: 5 (ref 5.0–8.0)

## 2022-01-25 NOTE — Telephone Encounter (Signed)
Follow-up as appropriate.  Given the holidays, were not going to be able to do this or schedule surgery any sooner.  The only option at this point in time would be to see if he would like to learn how to self cath and if he would, he can be scheduled with Sam or Larene [facility] next week if they have any availability.  [name], MD

## 2022-01-25 NOTE — ED Provider Notes (Signed)
[facility] Provider Note    Event Date/Time   First MD Initiated Contact with Patient 01/25/22 0221     (approximate)   History   Urinary Retention   HPI  Andre Edwards is a 76 y.o. male with history of BPH, hypertension, diabetes, hyperlipidemia who presents to the emergency department with urinary retention.  Just had his Foley catheter removed yesterday at the urologist office.  Has not been able to urinate several hours and has been getting to be uncomfortable.   History provided by patient and wife.    Past Medical History:  Diagnosis Date   Cataract    Diabetes mellitus without complication ([place])    Enlarged prostate    Hyperlipidemia    Hypertension    Pneumonia 02/2013   Sleep apnea     Past Surgical History:  Procedure Laterality Date   CHOLECYSTECTOMY     CHOLECYSTECTOMY N/A 07/04/2014   Procedure: LAPAROSCOPIC CHOLECYSTECTOMY;  Surgeon: Christene Lye, MD;  Location: [facility] ORS;  Service: General;  Laterality: N/A;   EYE SURGERY Bilateral 2015   PROSTATE SURGERY N/A     MEDICATIONS:  Prior to Admission medications   Medication Sig Start Date End Date Taking? Authorizing Provider  aspirin 81 MG chewable tablet Chew 81 mg by mouth daily.    [provider]  atorvastatin (LIPITOR) 10 MG tablet Take 10 mg by mouth daily.    [provider]  canagliflozin (INVOKANA) 300 MG TABS tablet Take 300 mg by mouth daily after breakfast.  05/02/14   [provider]  cetirizine (ZYRTEC) 10 MG tablet Take 10 mg by mouth daily.    [provider]  Chromium-Cinnamon 50-500 MCG-MG CAPS Take 500 mg/kg/day by mouth daily.     [provider]  clomiPHENE (CLOMID) 50 MG tablet Take 0.5 tablets (25 mg total) by mouth daily. 12/05/15   McKenzie, Candee Furbish, MD  diltiazem (DILACOR XR) 240 MG 24 hr capsule TAKE 1 CAPSULE (240 MG TOTAL) BY MOUTH ONCE DAILY. 10/19/15   [provider]  docusate sodium  (COLACE) 100 MG capsule Take 100 mg by mouth daily.     [provider]  FARXIGA 10 MG TABS tablet TAKE 1 TABLET BY MOUTH EVERY DAY IN THE MORNING 10/30/21   [provider]  finasteride (PROSCAR) 5 MG tablet Take 1 tablet (5 mg total) by mouth daily. 01/24/22   [name], MD  glipiZIDE (GLUCOTROL) 10 MG tablet Take 10 mg by mouth 2 (two) times daily before a meal.    [provider]  hydrochlorothiazide (HYDRODIURIL) 25 MG tablet Take 25 mg by mouth daily.    [provider]  magnesium oxide (MAG-OX) 400 MG tablet TAKE 1 TABLET (400 MG TOTAL) BY MOUTH ONCE DAILY. 05/08/16   [provider]  metFORMIN (GLUCOPHAGE) 1000 MG tablet Take 1,000 mg by mouth 2 (two) times daily with a meal.    [provider]  phenylephrine (SUDAFED PE) 10 MG TABS tablet Take 10 mg by mouth every 4 (four) hours as needed.    [provider]  tamsulosin (FLOMAX) 0.4 MG CAPS capsule TAKE 1 CAPSULE BY MOUTH TWICE A DAY 30 MINUTES AFTER BREAKFAST AND DINNER 12/07/21   [provider]  traMADol (ULTRAM) 50 MG tablet TAKE 1 TABLET (50 MG TOTAL) BY MOUTH EVERY 6 (SIX) HOURS AS NEEDED FOR PAIN FOR UP TO 30 DOSES 01/10/22   [provider]  vitamin B-12 (CYANOCOBALAMIN) 1000 MCG tablet  Take 1,000 mcg by mouth daily.    [provider]    Physical Exam   Triage Vital Signs: ED Triage Vitals  Enc Vitals Group     BP 01/25/22 0151 (!) 154/79     Pulse Rate 01/25/22 0151 96     Resp 01/25/22 0151 17     Temp 01/25/22 0151 97.7 F (36.5 C)     Temp Source 01/25/22 0151 Oral     SpO2 01/25/22 0151 95 %     Weight 01/25/22 0152 202 lb (91.6 kg)     Height 01/25/22 0152 '5\' 10"'$  (1.778 m)     Head Circumference --      Peak Flow --      Pain Score 01/25/22 0152 6     Pain Loc --      Pain Edu? --      Excl. in [place]? --     Most recent vital signs: Vitals:   01/25/22 0151 01/25/22 0310  BP: (!) 154/79 110/73  Pulse: 96 78  Resp:  17 19  Temp: 97.7 F (36.5 C) 97.9 F (36.6 C)  SpO2: 95% 96%    CONSTITUTIONAL: Alert and oriented and responds appropriately to questions.  Appears uncomfortable.  Elderly. HEAD: Normocephalic, atraumatic EYES: Conjunctivae clear, pupils appear equal, sclera nonicteric ENT: normal nose; moist mucous membranes NECK: Supple, normal ROM CARD: RRR; S1 and S2 appreciated; no murmurs, no clicks, no rubs, no gallops RESP: Normal chest excursion without splinting or tachypnea; breath sounds clear and equal bilaterally; no wheezes, no rhonchi, no rales, no hypoxia or respiratory distress, speaking full sentences ABD/GI: Normal bowel sounds; mild suprapubic tenderness on exam with a little bit of distention BACK: The back appears normal EXT: Normal ROM in all joints; no deformity noted, no edema; no cyanosis SKIN: Normal color for age and race; warm; no rash on exposed skin NEURO: Moves all extremities equally, normal speech PSYCH: The patient's mood and manner are appropriate.   ED Results / Procedures / Treatments   LABS: (all labs ordered are listed, but only abnormal results are displayed) Labs Reviewed  URINALYSIS, ROUTINE W REFLEX MICROSCOPIC - Abnormal; Notable for the following components:      Result Value   Color, Urine STRAW (*)    APPearance CLEAR (*)    Glucose, UA >=500 (*)    Hgb urine dipstick MODERATE (*)    All other components within normal limits  URINE CULTURE     EKG:   RADIOLOGY: My personal review and interpretation of imaging:    I have personally reviewed all radiology reports.   No results found.   PROCEDURES:  Critical Care performed: No     Procedures    IMPRESSION / MDM / ASSESSMENT AND PLAN / ED COURSE  I reviewed the triage vital signs and the nursing notes.    Patient here with urinary retention.  Foley catheter placed by nursing staff.  Had over 700 mL out and feels much better.    DIFFERENTIAL DIAGNOSIS (includes but not  limited to):   Urinary retention, BPH, UTI   Patient's presentation is most consistent with acute presentation with potential threat to life or bodily function.   PLAN: Patient feels much better after Foley catheter being placed.  Urine shows no sign of infection today.  Culture pending.  He is on already on finasteride.  Recommended close follow-up with urology.  Will discharge with Foley catheter in place.   MEDICATIONS GIVEN IN ED: Medications -  No data to display   ED COURSE:  At this time, I do not feel there is any life-threatening condition present. I reviewed all nursing notes, vitals, pertinent previous records.  All lab and urine results, EKGs, imaging ordered have been independently reviewed and interpreted by myself.  I reviewed all available radiology reports from any imaging ordered this visit.  Based on my assessment, I feel the patient is safe to be discharged home without further emergent workup and can continue workup as an outpatient as needed. Discussed all findings, treatment plan as well as usual and customary return precautions.  They verbalize understanding and are comfortable with this plan.  Outpatient follow-up has been provided as needed.  All questions have been answered.    CONSULTS:  none   OUTSIDE RECORDS REVIEWED: Reviewed last PCP note on 01/15/2022.  Reviewed urology note yesterday.  He was able to void 208 mL on his own after some time.       FINAL CLINICAL IMPRESSION(S) / ED DIAGNOSES   Final diagnoses:  Urinary retention     Rx / DC Orders   ED Discharge Orders     None        Note:  This document was prepared using Dragon voice recognition software and may include unintentional dictation errors.   Ashutosh Dieguez, Delice Bison, DO 01/25/22 [phone]

## 2022-01-25 NOTE — Discharge Instructions (Addendum)
Please schedule an appointment with your urologist for follow-up in the next 1 to 2 weeks.

## 2022-01-25 NOTE — Telephone Encounter (Signed)
Please advise 

## 2022-01-25 NOTE — Telephone Encounter (Signed)
Patient's wife called and said that patient had to go to the ER last night and they put catheter back in. She is concerned and wants him to see Dr. Erlene Quan again to figure out what is causing his urinary issues. He is scheduled for UA Trus/Cysto on 02/16/22. Should 2 part V/T be scheduled, or move up the procedure appt?

## 2022-01-25 NOTE — ED Triage Notes (Signed)
Pt comes from home via POV c/o difficulty urinating. Pt states he hasn't been able fully urinate since 11am yesterday morning. Pt was seen for same thing a couple days ago, catheter was placed at that time and then removed this morning by urology. NAD at this time. Denies CP, SOB

## 2022-01-25 NOTE — ED Notes (Signed)
Bladder scan- 614 mls

## 2022-01-25 NOTE — Telephone Encounter (Signed)
Spoke with patient and his wife. Moved up TRUS/CYSTO to 02/13/22. Patient will keep catheter in until then. Advised further plan of care will be discussed at that time.

## 2022-01-26 LAB — URINE CULTURE: Culture: NO GROWTH

## 2022-02-13 ENCOUNTER — Ambulatory Visit (INDEPENDENT_AMBULATORY_CARE_PROVIDER_SITE_OTHER): Payer: Medicare HMO | Admitting: Urology

## 2022-02-13 ENCOUNTER — Other Ambulatory Visit: Payer: Self-pay | Admitting: Urology

## 2022-02-13 VITALS — BP 130/78 | HR 89 | Ht 70.0 in

## 2022-02-13 DIAGNOSIS — N401 Enlarged prostate with lower urinary tract symptoms: Secondary | ICD-10-CM

## 2022-02-13 DIAGNOSIS — R339 Retention of urine, unspecified: Secondary | ICD-10-CM | POA: Diagnosis not present

## 2022-02-13 DIAGNOSIS — R338 Other retention of urine: Secondary | ICD-10-CM

## 2022-02-13 DIAGNOSIS — N138 Other obstructive and reflux uropathy: Secondary | ICD-10-CM

## 2022-02-13 NOTE — H&P (View-Only) (Signed)
02/13/22  Chief Complaint  Patient presents with   Procedure    TRUS/CYSTO     HPI: 77 y.o. year-old male with refractory urinary symptoms/urinary retention related to BPH who presents today to the office for cystoscopy and prostate sizing.   Please see previous notes for details.     Blood pressure 130/78, pulse 89, height '5\' 10"'$  (1.778 m). NED. A&Ox3.   No respiratory distress   Abd soft, NT, ND Normal phallus with bilateral descended testicles    Cystoscopy Procedure Note  Patient identification was confirmed, informed consent was obtained, and patient was prepped using Betadine solution.  Lidocaine jelly was administered per urethral meatus.    Preoperative abx where received prior to procedure.     Pre-Procedure: - Inspection reveals a normal caliber ureteral meatus.  Procedure: The flexible cystoscope was introduced without difficulty - No urethral strictures/lesions are present. - Enlarged prostate massive trilobar coaptation - Elevated bladder neck - Bilateral ureteral orifices identified - Bladder mucosa  reveals catheter cystitis around the bladder neck and posterior bladder wall, saccules appreciated laterally on either side with some mild debris - No bladder stones -Moderate trabeculation  Retroflexion shows slight intravesical protrusion with some bullous edema from catheter trauma   Post-Procedure: - Patient tolerated the procedure well   Prostate transrectal ultrasound sizing   Informed consent was obtained after discussing risks/benefits of the procedure.  A time out was performed to ensure correct patient identity.   Pre-Procedure: -Transrectal probe was placed without difficulty -Transrectal Ultrasound performed revealing a 181.3 gm prostate measuring 6.47 x 6.94 x 7.72 cm (length) -No significant hypoechoic or median lobe noted   Using standard sterile technique, 16 French Foley catheter was replaced today     Assessment/ Plan:   1.  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  We discussed the common postoperative course following holep including need for overnight Foley catheter, temporary worsening of irritative voiding symptoms, and occasional stress incontinence which typically lasts up to 6 months but can persist.  We discussed retrograde ejaculation and damage to surrounding structures including the urinary sphincter. Other uncommon complications including hematuria and urinary tract infection.  He understands all of the above and is willing to proceed as planned.  Alternatives including PAE versus continued medical management were also discussed.  He reports he prefers the above.    [name], MD

## 2022-02-13 NOTE — Progress Notes (Signed)
02/13/22  Chief Complaint  Patient presents with   Procedure    TRUS/CYSTO     HPI: 77 y.o. year-old male with refractory urinary symptoms/urinary retention related to BPH who presents today to the office for cystoscopy and prostate sizing.   Please see previous notes for details.     Blood pressure 130/78, pulse 89, height '5\' 10"'$  (1.778 m). NED. A&Ox3.   No respiratory distress   Abd soft, NT, ND Normal phallus with bilateral descended testicles    Cystoscopy Procedure Note  Patient identification was confirmed, informed consent was obtained, and patient was prepped using Betadine solution.  Lidocaine jelly was administered per urethral meatus.    Preoperative abx where received prior to procedure.     Pre-Procedure: - Inspection reveals a normal caliber ureteral meatus.  Procedure: The flexible cystoscope was introduced without difficulty - No urethral strictures/lesions are present. - Enlarged prostate massive trilobar coaptation - Elevated bladder neck - Bilateral ureteral orifices identified - Bladder mucosa  reveals catheter cystitis around the bladder neck and posterior bladder wall, saccules appreciated laterally on either side with some mild debris - No bladder stones -Moderate trabeculation  Retroflexion shows slight intravesical protrusion with some bullous edema from catheter trauma   Post-Procedure: - Patient tolerated the procedure well   Prostate transrectal ultrasound sizing   Informed consent was obtained after discussing risks/benefits of the procedure.  A time out was performed to ensure correct patient identity.   Pre-Procedure: -Transrectal probe was placed without difficulty -Transrectal Ultrasound performed revealing a 181.3 gm prostate measuring 6.47 x 6.94 x 7.72 cm (length) -No significant hypoechoic or median lobe noted   Using standard sterile technique, 16 French Foley catheter was replaced today     Assessment/ Plan:   1.  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  We discussed the common postoperative course following holep including need for overnight Foley catheter, temporary worsening of irritative voiding symptoms, and occasional stress incontinence which typically lasts up to 6 months but can persist.  We discussed retrograde ejaculation and damage to surrounding structures including the urinary sphincter. Other uncommon complications including hematuria and urinary tract infection.  He understands all of the above and is willing to proceed as planned.  Alternatives including PAE versus continued medical management were also discussed.  He reports he prefers the above.    [name], MD

## 2022-02-13 NOTE — Patient Instructions (Signed)

## 2022-02-13 NOTE — Progress Notes (Signed)
Surgical Physician Order Form [facility] [place]  * Scheduling expectation : Next Available  *Length of Case:   *Clearance needed: no  *Anticoagulation Instructions: Hold all anticoagulants  *Aspirin Instructions: Hold Aspirin  *Post-op visit Date/Instructions:  1 week voiding trial  *Diagnosis: BPH w/urinary obstruction  *Procedure:  HoLEP    Additional orders: N/A  -Admit type: OUTpatient  -Anesthesia: General  -VTE Prophylaxis Standing Order SCD's       Other:   -Standing Lab Orders Per Anesthesia    Lab other: UA&Urine Culture  (will need appt for UA from foley)  -Standing Test orders EKG/Chest x-ray per Anesthesia       Test other:   - Medications:  Ancef 2gm IV  -Other orders:  N/A

## 2022-02-14 ENCOUNTER — Telehealth: Payer: Self-pay | Admitting: *Deleted

## 2022-02-14 NOTE — Telephone Encounter (Signed)
Patient wife called in today and states he is leaking from his penis. But the urine is going into his bag. She was just worried about the wetness. I advised her he may be having some bladder spasms. He is not having any pain with spasms.   Advised him to wear depends.  He is ready to schedule surgery .

## 2022-02-15 ENCOUNTER — Telehealth: Payer: Self-pay

## 2022-02-15 NOTE — Telephone Encounter (Signed)
I spoke with Andre Edwards. We have discussed possible surgery dates and Monday January 15th, 2024  was agreed upon by all parties. Patient given information about surgery date, what to expect pre-operatively and post operatively.  We discussed that a Pre-Admission Testing office will be calling to set up the pre-op visit that will take place prior to surgery, and that these appointments are typically done over the phone with a Pre-Admissions RN. Informed patient that our office will communicate any additional care to be provided after surgery. Patients questions or concerns were discussed during our call. Advised to call our office should there be any additional information, questions or concerns that arise. Patient verbalized understanding.

## 2022-02-15 NOTE — Progress Notes (Signed)
   [facility]  Surgery Date: Date: 02/25/2022  Surgeon: Dr. 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

## 2022-02-18 ENCOUNTER — Ambulatory Visit: Payer: Medicare HMO | Admitting: Physician Assistant

## 2022-02-18 DIAGNOSIS — N401 Enlarged prostate with lower urinary tract symptoms: Secondary | ICD-10-CM

## 2022-02-18 DIAGNOSIS — N138 Other obstructive and reflux uropathy: Secondary | ICD-10-CM

## 2022-02-18 LAB — MICROSCOPIC EXAMINATION
RBC, Urine: >30 /hpf — AB (ref 0–2)
WBC, UA: >30 /hpf — AB (ref 0–5)

## 2022-02-18 LAB — URINALYSIS, COMPLETE
Bilirubin, UA: NEGATIVE
Ketones, UA: NEGATIVE
Nitrite, UA: POSITIVE — AB
Specific Gravity, UA: 1.015 (ref 1.005–1.030)
Urobilinogen, Ur: 0.2 mg/dL (ref 0.2–1.0)
pH, UA: 5.5 (ref 5.0–7.5)

## 2022-02-18 NOTE — Progress Notes (Signed)
Catheter plugged and urine sample obtained for preop UA, culture. Will notify patient with results when available.

## 2022-02-20 ENCOUNTER — Other Ambulatory Visit: Payer: Self-pay

## 2022-02-20 ENCOUNTER — Encounter
Admission: RE | Admit: 2022-02-20 | Discharge: 2022-02-20 | Disposition: A | Discharge status: Home / Self Care | Payer: Medicare HMO | Source: Ambulatory Visit | Attending: Urology | Admitting: Urology

## 2022-02-20 DIAGNOSIS — E119 Type 2 diabetes mellitus without complications: Secondary | ICD-10-CM

## 2022-02-20 DIAGNOSIS — Z01812 Encounter for preprocedural laboratory examination: Secondary | ICD-10-CM

## 2022-02-20 NOTE — Patient Instructions (Addendum)
Your procedure is scheduled on: 02/25/22 - Monday Report to the Registration Desk on the 1st floor of the [facility]. To find out your arrival time, please call [phone] between 1PM - 3PM on:02/22/22 - Friday If your arrival time is 6:00 am, do not arrive prior to that time as the [facility] entrance doors do not open until 6:00 am.  REMEMBER: Instructions that are not followed completely may result in serious medical risk, up to and including death; or upon the discretion of your surgeon and anesthesiologist your surgery may need to be rescheduled.  Do not eat food or drink any liquids after midnight the night before surgery.  No gum chewing, lozengers or hard candies.  TAKE THESE MEDICATIONS THE MORNING OF SURGERY WITH A SIP OF WATER:  - diltiazem (DILACOR XR)  - finasteride (PROSCAR)  - tamsulosin (FLOMAX)   Hold FARXIGA 3 days prior to your surgery beginning 02/22/22.  HOLD metFORMIN (GLUCOPHAGE) 2 days prior to your surgery beginning 02/23/22.  One week prior to surgery beginning 02/18/22. Stop Anti-inflammatories (NSAIDS) such as Advil, Aleve, Ibuprofen, Motrin, Naproxen, Naprosyn and Aspirin based products such as Excedrin, Goodys Powder, BC Powder.  Stop ANY OVER THE COUNTER supplements until after surgery.  You may take Tylenol if needed for pain up until the day of surgery.  No Alcohol for 24 hours before or after surgery.  No Smoking including e-cigarettes for 24 hours prior to surgery.  No chewable tobacco products for at least 6 hours prior to surgery.  No nicotine patches on the day of surgery.  Do not use any "recreational" drugs for at least a week prior to your surgery.  Please be advised that the combination of cocaine and anesthesia may have negative outcomes, up to and including death. If you test positive for cocaine, your surgery will be cancelled.  On the morning of surgery brush your teeth with toothpaste and water, you may rinse your mouth with  mouthwash if you wish. Do not swallow any toothpaste or mouthwash.  Do not wear jewelry, make-up, hairpins, clips or nail polish.  Do not wear lotions, powders, or perfumes.   Do not shave body from the neck down 48 hours prior to surgery just in case you cut yourself which could leave a site for infection. Also, freshly shaved skin may become irritated if using the CHG soap.  Contact lenses, hearing aids and dentures may not be worn into surgery.  Do not bring valuables to the hospital. [facility] is not responsible for any missing/lost belongings or valuables.   Notify your doctor if there is any change in your medical condition (cold, fever, infection).  Wear comfortable clothing (specific to your surgery type) to the hospital.  After surgery, you can help prevent lung complications by doing breathing exercises.  Take deep breaths and cough every 1-2 hours. Your doctor may order a device called an Incentive Spirometer to help you take deep breaths. When coughing or sneezing, hold a pillow firmly against your incision with both hands. This is called "splinting." Doing this helps protect your incision. It also decreases belly discomfort.  If you are being admitted to the hospital overnight, leave your suitcase in the car. After surgery it may be brought to your room.  If you are being discharged the day of surgery, you will not be allowed to drive home. You will need a responsible adult (18 years or older) to drive you home and stay with you that night.  If you are taking public transportation, you will need to have a responsible adult (18 years or older) with you. Please confirm with your physician that it is acceptable to use public transportation.   Please call the [facility] Dept. at [phone] if you have any questions about these instructions.  Surgery Visitation Policy:  Patients undergoing a surgery or procedure may have two family members or support  persons with them as long as the person is not COVID-19 positive or experiencing its symptoms.   Inpatient Visitation:    Visiting hours are 7 a.m. to 8 p.m. Up to four visitors are allowed at one time in a patient room. The visitors may rotate out with other people during the day. One designated support person (adult) may remain overnight.  Due to an increase in RSV and influenza rates and associated hospitalizations, children ages 21 and under will not be able to visit patients in [facility]. Masks continue to be strongly recommended.

## 2022-02-21 ENCOUNTER — Telehealth: Payer: Self-pay

## 2022-02-21 ENCOUNTER — Encounter
Admission: RE | Admit: 2022-02-21 | Discharge: 2022-02-21 | Disposition: A | Discharge status: Home / Self Care | Payer: Medicare HMO | Source: Ambulatory Visit | Attending: Urology | Admitting: Urology

## 2022-02-21 DIAGNOSIS — Z01818 Encounter for other preprocedural examination: Secondary | ICD-10-CM | POA: Insufficient documentation

## 2022-02-21 DIAGNOSIS — Z01812 Encounter for preprocedural laboratory examination: Secondary | ICD-10-CM

## 2022-02-21 DIAGNOSIS — Z1152 Encounter for screening for COVID-19: Secondary | ICD-10-CM | POA: Insufficient documentation

## 2022-02-21 MED ORDER — CIPROFLOXACIN HCL 500 MG PO TABS: 500.0000 mg | ORAL_TABLET | Freq: Two times a day (BID) | ORAL | 0 refills | Status: DC

## 2022-02-21 NOTE — Telephone Encounter (Signed)
Spoke with pt. Pt. Advised of all results and verbalized understanding. Patient requested [facility] in [place].

## 2022-02-21 NOTE — Telephone Encounter (Signed)
-----   Message from [name], MD sent at 02/21/2022 11:44 AM EST ----- Please treat with cipro 500 mg bid x 7 days starting today. OK with allergy.  Drug options are limited.    [name], MD

## 2022-02-22 ENCOUNTER — Ambulatory Visit (INDEPENDENT_AMBULATORY_CARE_PROVIDER_SITE_OTHER): Payer: Medicare HMO | Admitting: Urology

## 2022-02-22 DIAGNOSIS — R339 Retention of urine, unspecified: Secondary | ICD-10-CM

## 2022-02-22 DIAGNOSIS — N3289 Other specified disorders of bladder: Secondary | ICD-10-CM | POA: Diagnosis not present

## 2022-02-22 LAB — SARS CORONAVIRUS 2 (TAT 6-24 HRS): SARS Coronavirus 2: NEGATIVE

## 2022-02-22 LAB — CULTURE, URINE COMPREHENSIVE

## 2022-02-22 MED ORDER — GEMTESA 75 MG PO TABS: 75.0000 mg | ORAL_TABLET | Freq: Every day | ORAL | 0 refills | Status: DC

## 2022-02-22 NOTE — Progress Notes (Signed)
Cath Change/ Replacement His wife, Andre Edwards, called the office this morning stating that he had no urine output since 1 AM.  She states that she gets up in the night and changes the leg bag for him and she was concerned when she saw there was no urine in the bag.  When he presented to the office, he stated that he has been having a lot of leakage around the catheter from the penis associated with bladder spasms.  He has been wearing depends reinforced with large pads to control the leakage.  The depends he is wearing currently is dry, but the pad is soaked with urine.  Since he and his wife are already in the office, I decided to go ahead and change out his Foley catheter for a new 1 as the weekend is coming up and did not want them to have any more issues.  I also gave him Gemtesa 75 mg samples for the bladder spasms.    He did start the Cipro that was prescribed for him yesterday.  Patient is present today for a catheter change due to urinary retention.  9 ml of water was removed from the balloon, a 16 FR foley cath was removed without difficulty.  Patient was cleaned and prepped in a sterile fashion with betadine and 2% lidocaine jelly was instilled into the urethra. A 18  FR Coude foley cath was replaced into the bladder, no complications were noted. Urine return was noted 50 ml and urine was yellow in color. The balloon was filled with 71m of sterile water. A leg bag was attached for drainage.  A night bag was also given to the patient and patient was given instruction on how to change from one bag to another. Patient was given proper instruction on catheter care.    Performed by: SZara Council PA-C   Follow up: Monday for HoLEP procedure   I spent 10 minutes on the day of the encounter to include pre-visit record review, face-to-face time with the patient, and post-visit ordering of tests.

## 2022-02-25 ENCOUNTER — Ambulatory Visit
Admission: RE | Admit: 2022-02-25 | Discharge: 2022-02-25 | Disposition: A | Discharge status: Home / Self Care | Payer: Medicare HMO | Source: Ambulatory Visit | Attending: Urology | Admitting: Urology

## 2022-02-25 ENCOUNTER — Ambulatory Visit: Payer: Medicare HMO | Admitting: Urgent Care

## 2022-02-25 ENCOUNTER — Other Ambulatory Visit: Payer: Self-pay

## 2022-02-25 ENCOUNTER — Ambulatory Visit: Payer: Medicare HMO | Admitting: Anesthesiology

## 2022-02-25 ENCOUNTER — Encounter: Payer: Self-pay | Admitting: Urology

## 2022-02-25 ENCOUNTER — Encounter
Admission: RE | Disposition: A | Discharge status: Home / Self Care | Payer: Self-pay | Source: Ambulatory Visit | Attending: Urology

## 2022-02-25 DIAGNOSIS — G4730 Sleep apnea, unspecified: Secondary | ICD-10-CM | POA: Insufficient documentation

## 2022-02-25 DIAGNOSIS — N138 Other obstructive and reflux uropathy: Secondary | ICD-10-CM | POA: Insufficient documentation

## 2022-02-25 DIAGNOSIS — Z01812 Encounter for preprocedural laboratory examination: Secondary | ICD-10-CM

## 2022-02-25 DIAGNOSIS — E119 Type 2 diabetes mellitus without complications: Secondary | ICD-10-CM | POA: Diagnosis not present

## 2022-02-25 DIAGNOSIS — N401 Enlarged prostate with lower urinary tract symptoms: Secondary | ICD-10-CM | POA: Insufficient documentation

## 2022-02-25 DIAGNOSIS — R338 Other retention of urine: Secondary | ICD-10-CM | POA: Diagnosis not present

## 2022-02-25 DIAGNOSIS — C61 Malignant neoplasm of prostate: Secondary | ICD-10-CM | POA: Insufficient documentation

## 2022-02-25 DIAGNOSIS — N3289 Other specified disorders of bladder: Secondary | ICD-10-CM | POA: Insufficient documentation

## 2022-02-25 DIAGNOSIS — I10 Essential (primary) hypertension: Secondary | ICD-10-CM | POA: Diagnosis not present

## 2022-02-25 HISTORY — PX: HOLEP-LASER ENUCLEATION OF THE PROSTATE WITH MORCELLATION: SHX6641

## 2022-02-25 LAB — POCT I-STAT, CHEM 8
BUN: 18 mg/dL (ref 8–23)
Calcium, Ion: 0.96 mmol/L — ABNORMAL LOW (ref 1.15–1.40)
Chloride: 104 mmol/L (ref 98–111)
Creatinine, Ser: 1.1 mg/dL (ref 0.61–1.24)
Glucose, Bld: 262 mg/dL — ABNORMAL HIGH (ref 70–99)
HCT: 37 % — ABNORMAL LOW (ref 39.0–52.0)
Hemoglobin: 12.6 g/dL — ABNORMAL LOW (ref 13.0–17.0)
Potassium: 3.7 mmol/L (ref 3.5–5.1)
Sodium: 136 mmol/L (ref 135–145)
TCO2: 24 mmol/L (ref 22–32)

## 2022-02-25 LAB — GLUCOSE, CAPILLARY: Glucose-Capillary: 223 mg/dL — ABNORMAL HIGH (ref 70–99)

## 2022-02-25 SURGERY — ENUCLEATION, PROSTATE, USING LASER, WITH MORCELLATION
Anesthesia: General

## 2022-02-25 MED ORDER — DEXMEDETOMIDINE HCL IN NACL 200 MCG/50ML IV SOLN
INTRAVENOUS | Status: DC | PRN
  Administered 2022-02-25: 8 ug via INTRAVENOUS

## 2022-02-25 MED ORDER — ONDANSETRON HCL 4 MG/2ML IJ SOLN: 4.0000 mg | Freq: Once | INTRAMUSCULAR | Status: DC | PRN

## 2022-02-25 MED ORDER — LIDOCAINE HCL (CARDIAC) PF 100 MG/5ML IV SOSY
PREFILLED_SYRINGE | INTRAVENOUS | Status: DC | PRN
  Administered 2022-02-25: 80 mg via INTRAVENOUS

## 2022-02-25 MED ORDER — OXYCODONE HCL 5 MG PO TABS
ORAL_TABLET | ORAL | Status: AC
  Filled 2022-02-25: qty 1

## 2022-02-25 MED ORDER — LACTATED RINGERS IV SOLN: INTRAVENOUS | Status: DC

## 2022-02-25 MED ORDER — SODIUM CHLORIDE 0.9 % IR SOLN
Status: DC | PRN
  Administered 2022-02-25: 54000 mL

## 2022-02-25 MED ORDER — OXYCODONE HCL 5 MG PO TABS
5.0000 mg | ORAL_TABLET | Freq: Once | ORAL | Status: AC | PRN
  Administered 2022-02-25: 5 mg via ORAL

## 2022-02-25 MED ORDER — PROPOFOL 10 MG/ML IV BOLUS
INTRAVENOUS | Status: AC
  Filled 2022-02-25: qty 20

## 2022-02-25 MED ORDER — OXYCODONE HCL 5 MG/5ML PO SOLN: 5.0000 mg | Freq: Once | ORAL | Status: AC | PRN

## 2022-02-25 MED ORDER — ORAL CARE MOUTH RINSE: 15.0000 mL | Freq: Once | OROMUCOSAL | Status: AC

## 2022-02-25 MED ORDER — SUGAMMADEX SODIUM 200 MG/2ML IV SOLN
INTRAVENOUS | Status: DC | PRN
  Administered 2022-02-25: 190 mg via INTRAVENOUS

## 2022-02-25 MED ORDER — FAMOTIDINE 20 MG PO TABS
ORAL_TABLET | ORAL | Status: AC
  Administered 2022-02-25: 20 mg via ORAL
  Filled 2022-02-25: qty 1

## 2022-02-25 MED ORDER — FENTANYL CITRATE (PF) 100 MCG/2ML IJ SOLN
INTRAMUSCULAR | Status: AC
  Administered 2022-02-25: 25 ug via INTRAVENOUS
  Filled 2022-02-25: qty 2

## 2022-02-25 MED ORDER — DEXAMETHASONE SODIUM PHOSPHATE 10 MG/ML IJ SOLN
INTRAMUSCULAR | Status: DC | PRN
  Administered 2022-02-25: 5 mg via INTRAVENOUS

## 2022-02-25 MED ORDER — FENTANYL CITRATE (PF) 100 MCG/2ML IJ SOLN
INTRAMUSCULAR | Status: AC
  Filled 2022-02-25: qty 2

## 2022-02-25 MED ORDER — CEFAZOLIN SODIUM-DEXTROSE 2-4 GM/100ML-% IV SOLN
INTRAVENOUS | Status: AC
  Filled 2022-02-25: qty 100

## 2022-02-25 MED ORDER — ROCURONIUM BROMIDE 100 MG/10ML IV SOLN
INTRAVENOUS | Status: DC | PRN
  Administered 2022-02-25: 50 mg via INTRAVENOUS
  Administered 2022-02-25 (×2): 20 mg via INTRAVENOUS

## 2022-02-25 MED ORDER — SODIUM CHLORIDE 0.9 % IV SOLN: INTRAVENOUS | Status: DC

## 2022-02-25 MED ORDER — FENTANYL CITRATE (PF) 100 MCG/2ML IJ SOLN
INTRAMUSCULAR | Status: DC | PRN
  Administered 2022-02-25 (×2): 50 ug via INTRAVENOUS

## 2022-02-25 MED ORDER — FUROSEMIDE 10 MG/ML IJ SOLN
INTRAMUSCULAR | Status: AC
  Filled 2022-02-25: qty 4

## 2022-02-25 MED ORDER — ACETAMINOPHEN 10 MG/ML IV SOLN: 1000.0000 mg | Freq: Once | INTRAVENOUS | Status: DC | PRN

## 2022-02-25 MED ORDER — ONDANSETRON HCL 4 MG/2ML IJ SOLN
INTRAMUSCULAR | Status: AC
  Filled 2022-02-25: qty 2

## 2022-02-25 MED ORDER — CHLORHEXIDINE GLUCONATE 0.12 % MT SOLN: 15.0000 mL | Freq: Once | OROMUCOSAL | Status: AC

## 2022-02-25 MED ORDER — CEFAZOLIN SODIUM-DEXTROSE 2-4 GM/100ML-% IV SOLN
2.0000 g | INTRAVENOUS | Status: AC
  Administered 2022-02-25: 2 g via INTRAVENOUS

## 2022-02-25 MED ORDER — HYDROCODONE-ACETAMINOPHEN 5-325 MG PO TABS: 1.0000 | ORAL_TABLET | Freq: Four times a day (QID) | ORAL | 0 refills | Status: AC | PRN

## 2022-02-25 MED ORDER — ONDANSETRON HCL 4 MG/2ML IJ SOLN
INTRAMUSCULAR | Status: DC | PRN
  Administered 2022-02-25: 4 mg via INTRAVENOUS

## 2022-02-25 MED ORDER — FAMOTIDINE 20 MG PO TABS: 20.0000 mg | ORAL_TABLET | Freq: Once | ORAL | Status: AC

## 2022-02-25 MED ORDER — LIDOCAINE HCL (PF) 2 % IJ SOLN
INTRAMUSCULAR | Status: AC
  Filled 2022-02-25: qty 5

## 2022-02-25 MED ORDER — PROPOFOL 10 MG/ML IV BOLUS
INTRAVENOUS | Status: DC | PRN
  Administered 2022-02-25: 100 mg via INTRAVENOUS

## 2022-02-25 MED ORDER — CHLORHEXIDINE GLUCONATE 0.12 % MT SOLN
OROMUCOSAL | Status: AC
  Filled 2022-02-25: qty 15

## 2022-02-25 MED ORDER — FUROSEMIDE 10 MG/ML IJ SOLN
INTRAMUSCULAR | Status: DC | PRN
  Administered 2022-02-25: 10 mg via INTRAMUSCULAR

## 2022-02-25 MED ORDER — CHLORHEXIDINE GLUCONATE 0.12 % MT SOLN
OROMUCOSAL | Status: AC
  Administered 2022-02-25: 15 mL via OROMUCOSAL
  Filled 2022-02-25: qty 15

## 2022-02-25 MED ORDER — FENTANYL CITRATE (PF) 100 MCG/2ML IJ SOLN
25.0000 ug | INTRAMUSCULAR | Status: DC | PRN
  Administered 2022-02-25 (×3): 25 ug via INTRAVENOUS

## 2022-02-25 SURGICAL SUPPLY — 37 items
ADAPTER IRRIG TUBE 2 SPIKE SOL (ADAPTER) ×2 IMPLANT
ADPR TBG 2 SPK PMP STRL ASCP (ADAPTER) ×2
BAG DRN LRG CPC RND TRDRP CNTR (MISCELLANEOUS)
BAG DRN RND TRDRP ANRFLXCHMBR (UROLOGICAL SUPPLIES)
BAG URINE DRAIN 2000ML AR STRL (UROLOGICAL SUPPLIES) IMPLANT
BAG URO DRAIN 4000ML (MISCELLANEOUS) IMPLANT
CATH FOL 2WAY LX 20X30 (CATHETERS) IMPLANT
CATH FOL 2WAY LX 22X30 (CATHETERS) IMPLANT
CATH FOLEY 3WAY 30CC 22FR (CATHETERS) IMPLANT
CATH URETL OPEN END 4X70 (CATHETERS) ×1 IMPLANT
CONTAINER COLLECT MORCELLATR (MISCELLANEOUS) ×1 IMPLANT
DRAPE 3/4 80X56 (DRAPES) ×1 IMPLANT
DRAPE UTILITY 15X26 TOWEL STRL (DRAPES) IMPLANT
FIBER LASER MOSES 550 DFL (Laser) ×1 IMPLANT
FILTER OVERFLOW MORCELLATOR (FILTER) ×1 IMPLANT
GAUZE 4X4 16PLY ~~LOC~~+RFID DBL (SPONGE) ×2 IMPLANT
GLOVE BIO SURGEON STRL SZ 6.5 (GLOVE) ×2 IMPLANT
GOWN STRL REUS W/ TWL LRG LVL3 (GOWN DISPOSABLE) ×2 IMPLANT
GOWN STRL REUS W/TWL LRG LVL3 (GOWN DISPOSABLE) ×2
HOLDER FOLEY CATH W/STRAP (MISCELLANEOUS) ×1 IMPLANT
IV NS IRRIG 3000ML ARTHROMATIC (IV SOLUTION) ×4 IMPLANT
KIT TURNOVER CYSTO (KITS) ×1 IMPLANT
MBRN O SEALING YLW 17 FOR INST (MISCELLANEOUS) ×1
MEMBRANE SLNG YLW 17 FOR INST (MISCELLANEOUS) ×1 IMPLANT
MORCELLATOR COLLECT CONTAINER (MISCELLANEOUS) ×1
MORCELLATOR OVERFLOW FILTER (FILTER) ×1
MORCELLATOR ROTATION 4.75 335 (MISCELLANEOUS) ×1 IMPLANT
PACK CYSTO AR (MISCELLANEOUS) ×1 IMPLANT
SET CYSTO W/LG BORE CLAMP LF (SET/KITS/TRAYS/PACK) IMPLANT
SET IRRIG Y TYPE TUR BLADDER L (SET/KITS/TRAYS/PACK) ×1 IMPLANT
SLEEVE PROTECTION STRL DISP (MISCELLANEOUS) ×2 IMPLANT
SURGILUBE 2OZ TUBE FLIPTOP (MISCELLANEOUS) ×1 IMPLANT
SYR TOOMEY IRRIG 70ML (MISCELLANEOUS) ×1
SYRINGE TOOMEY IRRIG 70ML (MISCELLANEOUS) ×1 IMPLANT
TRAP FLUID SMOKE EVACUATOR (MISCELLANEOUS) ×1 IMPLANT
TUBE PUMP MORCELLATOR PIRANHA (TUBING) ×1 IMPLANT
WATER STERILE IRR 1000ML POUR (IV SOLUTION) ×1 IMPLANT

## 2022-02-25 NOTE — Anesthesia Procedure Notes (Signed)
Procedure Name: Intubation Date/Time: 02/25/2022 11:32 AM  Performed by: Hedda Slade, CRNAPre-anesthesia Checklist: Patient identified, Patient being monitored, Timeout performed, Emergency Drugs available and Suction available Patient Re-evaluated:Patient Re-evaluated prior to induction Oxygen Delivery Method: Circle system utilized Preoxygenation: Pre-oxygenation with 100% oxygen Induction Type: IV induction Ventilation: Mask ventilation without difficulty and Oral airway inserted - appropriate to patient size Laryngoscope Size: 4 and McGraph Grade View: Grade I Tube type: Oral Tube size: 7.0 mm Number of attempts: 1 Airway Equipment and Method: Stylet Placement Confirmation: ETT inserted through vocal cords under direct vision, positive ETCO2 and breath sounds checked- equal and bilateral Secured at: 22 cm Tube secured with: Tape Dental Injury: Teeth and Oropharynx as per pre-operative assessment

## 2022-02-25 NOTE — Anesthesia Postprocedure Evaluation (Signed)
Anesthesia Post Note  Patient: Andre Edwards  Procedure(s) Performed: HOLEP-LASER ENUCLEATION OF THE PROSTATE WITH MORCELLATION  Patient location during evaluation: PACU Anesthesia Type: General Level of consciousness: awake and alert, oriented and patient cooperative Pain management: pain level controlled Vital Signs Assessment: post-procedure vital signs reviewed and stable Respiratory status: spontaneous breathing, nonlabored ventilation and respiratory function stable Cardiovascular status: blood pressure returned to baseline and stable Postop Assessment: adequate PO intake Anesthetic complications: no   No notable events documented.   Last Vitals:  Vitals:   02/25/22 1415 02/25/22 1428  BP: 126/79   Pulse: 72 71  Resp: 12 10  Temp:    SpO2: 99% 95%    Last Pain:  Vitals:   02/25/22 1428  TempSrc:   PainSc: [place]

## 2022-02-25 NOTE — Discharge Instructions (Addendum)
Holmium Laser Enucleation of the Prostate (HoLEP)  HoLEP is a treatment for men with benign prostatic hyperplasia (BPH). The laser surgery removed blockages of urine flow, and is done without any incisions on the body.     What is HoLEP?  HoLEP is a type of laser surgery used to treat obstruction (blockage) of urine flow as a result of benign prostatic hyperplasia (BPH). In men with BPH, the prostate gland is not cancerous, but has become enlarged. An enlarged prostate can result in a number of urinary tract symptoms such as weak urinary stream, difficulty in starting urination, inability to urinate, frequent urination, or getting up at night to urinate.  HoLEP was developed in the 1990's as a more effective and less expensive surgical option for BPH, compared to other surgical options such as laser vaporization(PVP/greenlight laser), transurethral resection of the prostate(TURP), and open simple prostatectomy.   What happens during a HoLEP?  HoLEP requires general anesthesia ("asleep" throughout the procedure).   An antibiotic is given to reduce the risk of infection  A surgical instrument called a resectoscope is inserted through the urethra (the tube that carries urine from the bladder). The resectoscope has a camera that allows the surgeon to view the internal structure of the prostate gland, and to see where the incisions are being made during surgery.  The laser is inserted into the resectoscope and is used to enucleate (free up) the enlarged prostate tissue from the capsule (outer shell) and then to seal up any blood vessels. The tissue that has been removed is pushed back into the bladder.  A morcellator is placed through the resectoscope, and is used to suction out the prostate tissue that has been pushed into the bladder.  When the prostate tissue has been removed, the resectoscope is removed, and a foley catheter is placed to allow healing and drain the urine from the  bladder.     What happens after a HoLEP?  More than 90% of patients go home the same day a few hours after surgery. Less than 10% will be admitted to the hospital overnight for observation to monitor the urine, or if they have other medical problems.  Fluid is flushed through the catheter for about 1 hour after surgery to clear any blood from the urine. It is normal to have some blood in the urine after surgery. The need for blood transfusion is extremely rare.  Eating and drinking are permitted after the procedure once the patient has fully awakened from anesthesia.  The catheter is usually removed 2-3 days after surgery- the patient will come to clinic to have the catheter removed and make sure they can urinate on their own.  It is very important to drink lots of fluids after surgery for one week to keep the bladder flushed.  At first, there may be some burning with urination, but this typically improved within a few hours to days. Most patients do not have a significant amount of pain, and narcotic pain medications are rarely needed.  Symptoms of urinary frequency, urgency, and even leakage are NORMAL for the first few weeks after surgery as the bladder adjusts after having to work hard against blockage from the prostate for many years. This will improve, but can sometimes take several months.  The use of pelvic floor exercises (Kegel exercises) can help improve problems with urinary incontinence.   After catheter removal, patients will be seen at 6 weeks and 6 months for symptom check  No heavy lifting for   at least 2-3 weeks after surgery, however patients can walk and do light activities the first day after surgery. Return to work time depends on occupation.    What are the advantages of HoLEP?  HoLEP has been studied in many different parts of the world and has been shown to be a safe and effective procedure. Although there are many types of BPH surgeries available, HoLEP offers a  unique advantage in being able to remove a large amount of tissue without any incisions on the body, even in very large prostates, while decreasing the risk of bleeding and providing tissue for pathology (to look for cancer). This decreases the need for blood transfusions during surgery, minimizes hospital stay, and reduces the risk of needing repeat treatment.  What are the side effects of HoLEP?  Temporary burning and bleeding during urination. Some blood may be seen in the urine for weeks after surgery and is part of the healing process.  Urinary incontinence (inability to control urine flow) is expected in all patients immediately after surgery and they should wear pads for the first few days/weeks. This typically improves over the course of several weeks. Performing Kegel exercises can help decrease leakage from stress maneuvers such as coughing, sneezing, or lifting. The rate of long term leakage is very low. Patients may also have leakage with urgency and this may be treated with medication. The risk of urge incontinence can be dependent on several factors including age, prostate size, symptoms, and other medical problems.  Retrograde ejaculation or "backwards ejaculation." In 75% of cases, the patient will not see any fluid during ejaculation after surgery.  Erectile function is generally not significantly affected.   What are the risks of HoLEP?  Injury to the urethra or development of scar tissue at a later date  Injury to the capsule of the prostate (typically treated with longer catheterization).  Injury to the bladder or ureteral orifices (where the urine from the kidney drains out)  Infection of the bladder, testes, or kidneys  Return of urinary obstruction at a later date requiring another operation (<2%)  Need for blood transfusion or re-operation due to bleeding  Failure to relieve all symptoms and/or need for prolonged catheterization after surgery  5-15% of patients are  found to have previously undiagnosed prostate cancer in their specimen. Prostate cancer can be treated after HoLEP.  Standard risks of anesthesia including blood clots, heart attacks, etc  When should I call my doctor?  Fever over 101.3 degrees  Inability to urinate, or large blood clots in the urine  AMBULATORY SURGERY  DISCHARGE INSTRUCTIONS   The drugs that you were given will stay in your system until tomorrow so for the next 24 hours you should not:  Drive an automobile Make any legal decisions Drink any alcoholic beverage   You may resume regular meals tomorrow.  Today it is better to start with liquids and gradually work up to solid foods.  You may eat anything you prefer, but it is better to start with liquids, then soup and crackers, and gradually work up to solid foods.   Please notify your doctor immediately if you have any unusual bleeding, trouble breathing, redness and pain at the surgery site, drainage, fever, or pain not relieved by medication.    Additional Instructions:        Please contact your physician with any problems or Same Day Surgery at [phone], Monday through Friday 6 am to 4 pm, or [facility] Main number

## 2022-02-25 NOTE — Transfer of Care (Signed)
Immediate Anesthesia Transfer of Care Note  Patient: Andre Edwards  Procedure(s) Performed: HOLEP-LASER ENUCLEATION OF THE PROSTATE WITH MORCELLATION  Patient Location: PACU  Anesthesia Type:General  Level of Consciousness: sedated  Airway & Oxygen Therapy: Patient Spontanous Breathing and Patient connected to face mask oxygen  Post-op Assessment: Report given to RN and Post -op Vital signs reviewed and stable  Post vital signs: Reviewed  Last Vitals:  Vitals Value Taken Time  BP 123/81   Temp    Pulse 68   Resp    SpO2 99     Last Pain:  Vitals:   02/25/22 1007  TempSrc: Oral         Complications: No notable events documented.

## 2022-02-25 NOTE — Interval H&P Note (Signed)
History and Physical Interval Note:  02/25/2022 10:41 AM  Andre Edwards  has presented today for surgery, with the diagnosis of Benign Prostatic Hyperplasia with Urinary Obstruction.  The various methods of treatment have been discussed with the patient and family. After consideration of risks, benefits and other options for treatment, the patient has consented to  Procedure(s): [place] WITH MORCELLATION (N/A) as a surgical intervention.  The patient's history has been reviewed, patient examined, no change in status, stable for surgery.  I have reviewed the patient's chart and labs.  Questions were answered to the patient's satisfaction.    RRR CTAB  + preop UCx on abx   [name]
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
## 2022-02-25 NOTE — Op Note (Signed)
Date of procedure: 02/25/22  Preoperative diagnosis:  BPH with BOO Urinary retention  Postoperative diagnosis:  same   Procedure: HoLEP with morcellation  Surgeon: [name], MD  Anesthesia: General  Complications: None  Intraoperative findings: Bilobar hypertrophy with absent median lobe.  Dilated blood vessels of blood bladder neck.  Trabeculated bladder.  EBL: Minimal  Specimens: Prostate chips  Drains: 22 for Foley catheter with 50 cc in the balloon  Indication: Andre Edwards is a 77 y.o. patient with refractory urinary retention and massive prostamegaly.  After reviewing the management options for treatment, he elected to proceed with the above surgical procedure(s). We have discussed the potential benefits and risks of the procedure, side effects of the proposed treatment, the likelihood of the patient achieving the goals of the procedure, and any potential problems that might occur during the procedure or recuperation. Informed consent has been obtained.  Description of procedure:  The patient was taken to the operating room and general anesthesia was induced.  The patient was placed in the dorsal lithotomy position, prepped and draped in the usual sterile fashion, and preoperative antibiotics were administered. A preoperative time-out was performed.     A 26 French resectoscope sheath using a blunt angled obturator was introduced without difficulty into the bladder.  The bladder was carefully inspected and noted to be moderately trabeculated.  There is an elevated bladder neck with a very small intravesical component.  The trigone was able to be visualized with some manipulation and the UOs were were good distance from the bladder neck itself.  The prostatic fossa had significant trilobar coaptation with greater than 5 cm prostatic length.  A 550 m laser fiber was then brought in and using settings of 2 J's and 60 Hz, 2 incisions were created at the 5:00 and 7:00  positions of the bladder neck  midline just above the verumontanum.    Next, a semilunar incision was created at the prostatic apex on the left side again freeing up the adenoma from the underlying capsule.  Care was taken to avoid any resection past the verumontanum.  This incision was carried around laterally and cranially towards the bladder neck.  Ultimately, I was able to complete the anterior commissure mucosa and the adenoma into the bladder creating a widely patent prostatic fossa.     Next, the same similar incision was created at the right prostatic apex.  This adenoma however ended up being enucleated and more of a piece wise fashion freeing up a large BPH nodules from the capsular fibers.  Once this was completed and cleared from the bladder neck, the prostatic fossa was noted to be widely patent.  Hemostasis was achieved using hemostatic fiber settings.  Bilateral UOs were visualized and free of any injury.  Finally, the 40 French resectoscope was exchanged for nephroscope and using the [place]ha handpiece morcellator, the bladder was distended in each of the prostate chips were evacuated.  The bladder was irrigated several times and chips were clear for the bladder.  This point time, there were no residual fibers appreciated in the bladder.  Hemostasis was adequate.  10 mg of IV Lasix was administered to help with postoperative diuresis.  A 22 French two-way Foley catheter was then inserted over a catheter guide with 50 cc in the balloon.  The catheter irrigated easily and well.  Patient was then clean and dry, repositioned supine position, reversed from anesthesia, taken to PACU in stable condition.   Plan: Patient will return to  the office in 1 week for voiding trial.  He to resume baby aspirin tomorrow if urine remains relatively clear.    [name], M.D.

## 2022-02-26 ENCOUNTER — Encounter: Payer: Medicare HMO | Admitting: Urology

## 2022-02-26 ENCOUNTER — Encounter: Payer: Self-pay | Admitting: Urology

## 2022-02-26 ENCOUNTER — Telehealth: Payer: Self-pay | Admitting: Physician Assistant

## 2022-02-26 NOTE — Telephone Encounter (Signed)
Pt has foley in.  He is concerned about the tube attachment. Please call pt regarding his foley [phone]

## 2022-02-26 NOTE — Telephone Encounter (Signed)
Patient wanted to know about this appt. He will see [place] for his appt on Monday .

## 2022-02-27 ENCOUNTER — Telehealth: Payer: Self-pay

## 2022-02-27 NOTE — Telephone Encounter (Signed)
Pt's wife calls triage line and states that the patient has been unable to have a bowel movement. He has been eating prunes as well as taking Senna-S (docusate with laxative) with improvement. Advised pt to increase hydration as wife does not think he is drinking enough. Please advise on additional recommendations if any.

## 2022-02-28 LAB — SURGICAL PATHOLOGY

## 2022-02-28 NOTE — Telephone Encounter (Signed)
He could also try MiraLAX or Dulcolax suppository.  Agree with hydration.  [name], MD

## 2022-02-28 NOTE — Telephone Encounter (Signed)
Mrs Briles advised

## 2022-03-04 ENCOUNTER — Ambulatory Visit (INDEPENDENT_AMBULATORY_CARE_PROVIDER_SITE_OTHER): Payer: Medicare HMO | Admitting: Urology

## 2022-03-04 ENCOUNTER — Encounter: Payer: Medicare HMO | Admitting: Urology

## 2022-03-04 DIAGNOSIS — R339 Retention of urine, unspecified: Secondary | ICD-10-CM

## 2022-03-04 DIAGNOSIS — N401 Enlarged prostate with lower urinary tract symptoms: Secondary | ICD-10-CM | POA: Diagnosis not present

## 2022-03-04 LAB — BLADDER SCAN AMB NON-IMAGING: Scan Result: 18

## 2022-03-04 NOTE — Progress Notes (Unsigned)
Catheter Removal  Patient is present today for a catheter removal.  65m of water was drained from the balloon. A 22FR foley cath was removed from the bladder, no complications were noted. Patient tolerated well.  Performed by: CElberta Leatherwood CMA  Follow up/ Additional notes: PM PVR

## 2022-03-06 ENCOUNTER — Encounter: Payer: Self-pay | Admitting: Urology

## 2022-03-06 NOTE — Progress Notes (Signed)
03/04/2022 7:23 AM   Andre Edwards [birth year] [id number]  Referring provider: Idelle Crouch, MD [place] [facility] [place],  [place] [address]  Chief Complaint  Patient presents with   Post-op Follow-up    HPI: Status post HoLEP with Dr. Erlene Quan 02/25/2022.  Catheter was removed this morning.  He has been incontinent but states he has not voided a urinary stream.  No sensation of incomplete bladder emptying.   PMH: Past Medical History:  Diagnosis Date   Cataract    Diabetes mellitus without complication ([place])    Enlarged prostate    Hyperlipidemia    Hypertension    Pneumonia 02/2013   Sleep apnea     Surgical History: Past Surgical History:  Procedure Laterality Date   CHOLECYSTECTOMY     CHOLECYSTECTOMY N/A 07/04/2014   Procedure: LAPAROSCOPIC CHOLECYSTECTOMY;  Surgeon: Christene Lye, MD;  Location: [facility] ORS;  Service: General;  Laterality: N/A;   EYE SURGERY Bilateral 2015   HOLEP-LASER ENUCLEATION OF THE PROSTATE WITH MORCELLATION N/A 02/25/2022   Procedure: HOLEP-LASER ENUCLEATION OF THE PROSTATE WITH MORCELLATION;  Surgeon: [name], MD;  Location: [facility] ORS;  Service: Urology;  Laterality: N/A;   PROSTATE SURGERY N/A     Home Medications:  Allergies as of 03/04/2022       Reactions   Amoxicillin-pot Clavulanate Other (See Comments)   Erythromycin Itching, Swelling   Levofloxacin Itching   tongue   Sulfa Antibiotics    Other reaction(s): Unknown   Tetracycline    Other reaction(s): Unknown        Medication List        Accurate as of March 04, 2022 11:59 PM. If you have any questions, ask your nurse or doctor.          acetaminophen 325 MG tablet Commonly known as: TYLENOL Take by mouth every 6 (six) hours as needed for mild pain.   aspirin 81 MG chewable tablet Chew 81 mg by mouth daily.   atorvastatin 10 MG tablet Commonly known as: LIPITOR Take 10 mg by mouth daily.   calcium carbonate 750  MG chewable tablet Commonly known as: TUMS EX Chew 2 tablets by mouth as needed for heartburn.   cetirizine 10 MG tablet Commonly known as: ZYRTEC Take 10 mg by mouth daily.   ciprofloxacin 500 MG tablet Commonly known as: CIPRO Take 1 tablet (500 mg total) by mouth every 12 (twelve) hours.   cyanocobalamin 1000 MCG tablet Commonly known as: VITAMIN B12 Take 1,000 mcg by mouth daily.   diltiazem 240 MG 24 hr capsule Commonly known as: DILACOR XR TAKE 1 CAPSULE (240 MG TOTAL) BY MOUTH ONCE DAILY.   docusate sodium 100 MG capsule Commonly known as: COLACE Take 100 mg by mouth daily.   Farxiga 10 MG Tabs tablet Generic drug: dapagliflozin propanediol TAKE 1 TABLET BY MOUTH EVERY DAY IN THE MORNING   finasteride 5 MG tablet Commonly known as: PROSCAR Take 1 tablet (5 mg total) by mouth daily.   Gemtesa 75 MG Tabs Generic drug: Vibegron Take 75 mg by mouth daily.   glipiZIDE 10 MG tablet Commonly known as: GLUCOTROL Take 10 mg by mouth 2 (two) times daily before a meal.   hydrochlorothiazide 25 MG tablet Commonly known as: HYDRODIURIL Take 25 mg by mouth daily.   HYDROcodone-acetaminophen 5-325 MG tablet Commonly known as: NORCO/VICODIN Take 1-2 tablets by mouth every 6 (six) hours as needed for moderate pain.   magnesium oxide 400 MG  tablet Commonly known as: MAG-OX TAKE 1 TABLET (400 MG TOTAL) BY MOUTH ONCE DAILY.   metFORMIN 1000 MG tablet Commonly known as: GLUCOPHAGE Take 1,000 mg by mouth 2 (two) times daily with a meal.   phenylephrine 10 MG Tabs tablet Commonly known as: SUDAFED PE Take 10 mg by mouth every 4 (four) hours as needed.   tamsulosin 0.4 MG Caps capsule Commonly known as: FLOMAX TAKE 1 CAPSULE BY MOUTH TWICE A DAY 30 MINUTES AFTER BREAKFAST AND DINNER   traMADol 50 MG tablet Commonly known as: ULTRAM TAKE 1 TABLET (50 MG TOTAL) BY MOUTH EVERY 6 (SIX) HOURS AS NEEDED FOR PAIN FOR UP TO 30 DOSES        Allergies:  Allergies   Allergen Reactions   Amoxicillin-Pot Clavulanate Other (See Comments)   Erythromycin Itching and Swelling   Levofloxacin Itching    tongue   Sulfa Antibiotics     Other reaction(s): Unknown   Tetracycline     Other reaction(s): Unknown    Family History: No family history on file.  Social History:  reports that he has never smoked. His smokeless tobacco use includes chew. He reports that he does not drink alcohol and does not use drugs.   Physical Exam: Constitutional:  Alert and oriented, No acute distress. Psychiatric: Normal mood and affect.   Assessment & Plan:    1.  BPH with urinary retention Status post HoLEP PVR today 18 mL We urinary incontinence in the immediate postop period his common with his procedure.  Discussed Kegel exercises to perform 3 sets of 10 throughout the day but not while voiding. Postop follow-up with Dr. Erlene Quan 1 month   Abbie Sons, MD  [facility] [address], [place], [place] [address] [phone]

## 2022-04-09 ENCOUNTER — Ambulatory Visit: Payer: Medicare HMO | Admitting: Urology

## 2022-04-09 ENCOUNTER — Encounter: Payer: Self-pay | Admitting: Urology

## 2022-04-09 VITALS — BP 108/63 | HR 97 | Ht 70.0 in | Wt 205.0 lb

## 2022-04-09 DIAGNOSIS — N138 Other obstructive and reflux uropathy: Secondary | ICD-10-CM

## 2022-04-09 DIAGNOSIS — R339 Retention of urine, unspecified: Secondary | ICD-10-CM | POA: Diagnosis not present

## 2022-04-09 DIAGNOSIS — C61 Malignant neoplasm of prostate: Secondary | ICD-10-CM

## 2022-04-09 DIAGNOSIS — N393 Stress incontinence (female) (male): Secondary | ICD-10-CM

## 2022-04-09 DIAGNOSIS — N401 Enlarged prostate with lower urinary tract symptoms: Secondary | ICD-10-CM

## 2022-04-09 LAB — BLADDER SCAN AMB NON-IMAGING: Scan Result: 33

## 2022-04-09 NOTE — Progress Notes (Signed)
I,Amy L Pierron,acting as a scribe for [name], MD.,have documented all relevant documentation on the behalf of [name], MD,as directed by  [name], MD while in the presence of [name], MD.   04/09/2022 4:42 PM   Andre Edwards Nov 11, 1945 [id number]  Referring provider: Idelle Crouch, MD [place] [facility] [place],  [place] [address]  Chief Complaint  Patient presents with   Follow-up    HPI: 77 year-old male with a personal history of massive BPH, TRUS volume of 181, and chronic urinary retention s/p voiding trial, returns today for surgical follow up.   He underwent HoLEP on 02/25/2022. Foley catheter remained in place one week post-op when he returned for a voiding trial. 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 A total of 79.9 gram resection.  He reports that his leakage is decreasing however he does wear a diaper at night and a pad during the day.  He reports redness on his scrotum that has surfaced in the last week or so which causes burning.   Results for orders placed or performed in visit on 04/09/22  Bladder Scan (Post Void Residual) in office  Result Value Ref Range   Scan Result 33      IPSS     Row Name 04/09/22 1400         International Prostate Symptom Score   How often have you had the sensation of not emptying your bladder? About half the time     How often have you had to urinate less than every two hours? Almost always     How often have you found you stopped and started again several times when you urinated? About half the time     How often have you found it difficult to postpone urination? Less than half the time     How often have you had a weak urinary stream? Less than half the time     How often have you had to strain to start urination? Less than 1 in 5 times     How many times did you typically get up at night to urinate? 2 Times      Total IPSS Score 18       Quality of Life due to urinary symptoms   If you were to spend the rest of your life with your urinary condition just the way it is now how would you feel about that? Terrible             Score:  1-7 Mild 8-19 Moderate 20-35 Severe    PMH: Past Medical History:  Diagnosis Date   Cataract    Diabetes mellitus without complication ([place])    Enlarged prostate    Hyperlipidemia    Hypertension    Pneumonia 02/2013   Sleep apnea     Surgical History: Past Surgical History:  Procedure Laterality Date   CHOLECYSTECTOMY     CHOLECYSTECTOMY N/A 07/04/2014   Procedure: LAPAROSCOPIC CHOLECYSTECTOMY;  Surgeon: Christene Lye, MD;  Location: [facility] ORS;  Service: General;  Laterality: N/A;   EYE SURGERY Bilateral 2015   HOLEP-LASER ENUCLEATION OF THE PROSTATE WITH MORCELLATION N/A 02/25/2022   Procedure: HOLEP-LASER ENUCLEATION OF THE PROSTATE WITH MORCELLATION;  Surgeon: [name], MD;  Location: [facility] ORS;  Service: Urology;  Laterality: N/A;   PROSTATE SURGERY N/A     Home Medications:  Allergies as of 04/09/2022  Reactions   Amoxicillin-pot Clavulanate Other (See Comments)   Erythromycin Itching, Swelling   Levofloxacin Itching   tongue   Sulfa Antibiotics    Other reaction(s): Unknown   Tetracycline    Other reaction(s): Unknown        Medication List        Accurate as of April 09, 2022  4:42 PM. If you have any questions, ask your nurse or doctor.          acetaminophen 325 MG tablet Commonly known as: TYLENOL Take by mouth every 6 (six) hours as needed for mild pain.   aspirin 81 MG chewable tablet Chew 81 mg by mouth daily.   atorvastatin 10 MG tablet Commonly known as: LIPITOR Take 10 mg by mouth daily.   calcium carbonate 750 MG chewable tablet Commonly known as: TUMS EX Chew 2 tablets by mouth as needed for heartburn.   cetirizine 10 MG tablet Commonly known as: ZYRTEC Take 10 mg by mouth  daily.   ciprofloxacin 500 MG tablet Commonly known as: CIPRO Take 1 tablet (500 mg total) by mouth every 12 (twelve) hours.   cyanocobalamin 1000 MCG tablet Commonly known as: VITAMIN B12 Take 1,000 mcg by mouth daily.   diltiazem 240 MG 24 hr capsule Commonly known as: DILACOR XR TAKE 1 CAPSULE (240 MG TOTAL) BY MOUTH ONCE DAILY.   docusate sodium 100 MG capsule Commonly known as: COLACE Take 100 mg by mouth daily.   Farxiga 10 MG Tabs tablet Generic drug: dapagliflozin propanediol TAKE 1 TABLET BY MOUTH EVERY DAY IN THE MORNING   finasteride 5 MG tablet Commonly known as: PROSCAR Take 1 tablet (5 mg total) by mouth daily.   Gemtesa 75 MG Tabs Generic drug: Vibegron Take 75 mg by mouth daily.   glipiZIDE 10 MG tablet Commonly known as: GLUCOTROL Take 10 mg by mouth 2 (two) times daily before a meal.   hydrochlorothiazide 25 MG tablet Commonly known as: HYDRODIURIL Take 25 mg by mouth daily.   HYDROcodone-acetaminophen 5-325 MG tablet Commonly known as: NORCO/VICODIN Take 1-2 tablets by mouth every 6 (six) hours as needed for moderate pain.   magnesium oxide 400 MG tablet Commonly known as: MAG-OX TAKE 1 TABLET (400 MG TOTAL) BY MOUTH ONCE DAILY.   metFORMIN 1000 MG tablet Commonly known as: GLUCOPHAGE Take 1,000 mg by mouth 2 (two) times daily with a meal.   phenylephrine 10 MG Tabs tablet Commonly known as: SUDAFED PE Take 10 mg by mouth every 4 (four) hours as needed.   tamsulosin 0.4 MG Caps capsule Commonly known as: FLOMAX TAKE 1 CAPSULE BY MOUTH TWICE A DAY 30 MINUTES AFTER BREAKFAST AND DINNER   traMADol 50 MG tablet Commonly known as: ULTRAM TAKE 1 TABLET (50 MG TOTAL) BY MOUTH EVERY 6 (SIX) HOURS AS NEEDED FOR PAIN FOR UP TO 30 DOSES        Allergies:  Allergies  Allergen Reactions   Amoxicillin-Pot Clavulanate Other (See Comments)   Erythromycin Itching and Swelling   Levofloxacin Itching    tongue   Sulfa Antibiotics     Other  reaction(s): Unknown   Tetracycline     Other reaction(s): Unknown   Family History: History reviewed. No pertinent family history.  Social History:  reports that he has never smoked. His smokeless tobacco use includes chew. He reports that he does not drink alcohol and does not use drugs.  Physical Exam: BP 108/63   Pulse 97   Ht '5\' 10"'$  (1.778 m)  Wt 205 lb (93 kg)   BMI 29.41 kg/m   Constitutional:  Alert and oriented, No acute distress. GU: Area of erythema underneath left phallus, skin integrity was good.  No fluctuation or concern for cellulitis. HEENT: [place] AT, moist mucus membranes.  Trachea midline, no masses. Neurologic: Grossly intact, no focal deficits, moving all 4 extremities. Psychiatric: Normal mood and affect.   Assessment & Plan:    BPH with urinary retention  - Status post successful voiding trial.   -Voiding well, emptying well.  - Discontinue the Flomax.   - Continue Gemtesa and re-evaluate in 6 months.    2. Stress urinary incontinence  - Slowly improving  - Given a referral to pelvic floor physical therapy.   - Area of erythema just underneath his left phallus appeared to be more like an irritation rather than an infectious process. Appears similar to a skin irritation from chronic dampness. Recommend a vapor barrier cream like Desitin or Butt Paste. If it fails to improve we can call in Mycolog. He will let us know if it improves.  3. Incidental Prostate Cancer    - Discussed it is quite commown to have low risk prostate cancer. Given his age we do not recommend any intervention. Consider repeating a PSA in a year to ensure there is no underlying issue.   Return in about 6 months (around 10/08/2022) for IPSS, PVR, PSA.  I have reviewed the above documentation for accuracy and completeness, and I agree with the above.   [name], MD   [facility] [address] [place], [place] [address] [phone]

## 2022-04-23 NOTE — Progress Notes (Signed)
04/24/2022 4:35 PM   Andre Edwards [birth year] [id number]  Referring provider: Idelle Crouch, MD [place] [facility] [place],  [place] [address]  Urological history: 1. Prostate cancer -PSA (12/2021) 6.44 -found on HoLEP (02/2022) - INCIDENTAL PROSTATIC ACINAR ADENOCARCINOMA, GLEASON 3+3 (GRADE GROUP 1), INVOLVING 1% OF THE TISSUE EXAMINED. - has appointment in August  2. BPH with LU TS -s/p HoLEP (02/2022)  -Gemtesa 75 mg daily  No chief complaint on file.   HPI: Andre Edwards is a 77 y.o. male who presents today for scrotal irritation.    PMH: Past Medical History:  Diagnosis Date   Cataract    Diabetes mellitus without complication ([place])    Enlarged prostate    Hyperlipidemia    Hypertension    Pneumonia 02/2013   Sleep apnea     Surgical History: Past Surgical History:  Procedure Laterality Date   CHOLECYSTECTOMY     CHOLECYSTECTOMY N/A 07/04/2014   Procedure: LAPAROSCOPIC CHOLECYSTECTOMY;  Surgeon: Christene Lye, MD;  Location: [facility] ORS;  Service: General;  Laterality: N/A;   EYE SURGERY Bilateral 2015   HOLEP-LASER ENUCLEATION OF THE PROSTATE WITH MORCELLATION N/A 02/25/2022   Procedure: HOLEP-LASER ENUCLEATION OF THE PROSTATE WITH MORCELLATION;  Surgeon: [name], MD;  Location: [facility] ORS;  Service: Urology;  Laterality: N/A;   PROSTATE SURGERY N/A     Home Medications:  Allergies as of 04/24/2022       Reactions   Amoxicillin-pot Clavulanate Other (See Comments)   Erythromycin Itching, Swelling   Levofloxacin Itching   tongue   Sulfa Antibiotics    Other reaction(s): Unknown   Tetracycline    Other reaction(s): Unknown        Medication List        Accurate as of April 23, 2022  4:35 PM. If you have any questions, ask your nurse or doctor.          acetaminophen 325 MG tablet Commonly known as: TYLENOL Take by mouth every 6 (six) hours as needed for mild pain.   aspirin 81 MG chewable  tablet Chew 81 mg by mouth daily.   atorvastatin 10 MG tablet Commonly known as: LIPITOR Take 10 mg by mouth daily.   calcium carbonate 750 MG chewable tablet Commonly known as: TUMS EX Chew 2 tablets by mouth as needed for heartburn.   cetirizine 10 MG tablet Commonly known as: ZYRTEC Take 10 mg by mouth daily.   ciprofloxacin 500 MG tablet Commonly known as: CIPRO Take 1 tablet (500 mg total) by mouth every 12 (twelve) hours.   cyanocobalamin 1000 MCG tablet Commonly known as: VITAMIN B12 Take 1,000 mcg by mouth daily.   diltiazem 240 MG 24 hr capsule Commonly known as: DILACOR XR TAKE 1 CAPSULE (240 MG TOTAL) BY MOUTH ONCE DAILY.   docusate sodium 100 MG capsule Commonly known as: COLACE Take 100 mg by mouth daily.   Farxiga 10 MG Tabs tablet Generic drug: dapagliflozin propanediol TAKE 1 TABLET BY MOUTH EVERY DAY IN THE MORNING   finasteride 5 MG tablet Commonly known as: PROSCAR Take 1 tablet (5 mg total) by mouth daily.   Gemtesa 75 MG Tabs Generic drug: Vibegron Take 75 mg by mouth daily.   glipiZIDE 10 MG tablet Commonly known as: GLUCOTROL Take 10 mg by mouth 2 (two) times daily before a meal.   hydrochlorothiazide 25 MG tablet Commonly known as: HYDRODIURIL Take 25 mg by mouth daily.   HYDROcodone-acetaminophen 5-325 MG  tablet Commonly known as: NORCO/VICODIN Take 1-2 tablets by mouth every 6 (six) hours as needed for moderate pain.   magnesium oxide 400 MG tablet Commonly known as: MAG-OX TAKE 1 TABLET (400 MG TOTAL) BY MOUTH ONCE DAILY.   metFORMIN 1000 MG tablet Commonly known as: GLUCOPHAGE Take 1,000 mg by mouth 2 (two) times daily with a meal.   phenylephrine 10 MG Tabs tablet Commonly known as: SUDAFED PE Take 10 mg by mouth every 4 (four) hours as needed.   tamsulosin 0.4 MG Caps capsule Commonly known as: FLOMAX TAKE 1 CAPSULE BY MOUTH TWICE A DAY 30 MINUTES AFTER BREAKFAST AND DINNER   traMADol 50 MG tablet Commonly known as:  ULTRAM TAKE 1 TABLET (50 MG TOTAL) BY MOUTH EVERY 6 (SIX) HOURS AS NEEDED FOR PAIN FOR UP TO 30 DOSES        Allergies:  Allergies  Allergen Reactions   Amoxicillin-Pot Clavulanate Other (See Comments)   Erythromycin Itching and Swelling   Levofloxacin Itching    tongue   Sulfa Antibiotics     Other reaction(s): Unknown   Tetracycline     Other reaction(s): Unknown    Family History: No family history on file.  Social History:  reports that he has never smoked. His smokeless tobacco use includes chew. He reports that he does not drink alcohol and does not use drugs.  ROS: Pertinent ROS in HPI  Physical Exam: There were no vitals taken for this visit.  Constitutional:  Well nourished. Alert and oriented, No acute distress. HEENT: [place] AT, moist mucus membranes.  Trachea midline, no masses. Cardiovascular: No clubbing, cyanosis, or edema. Respiratory: Normal respiratory effort, no increased work of breathing. GI: Abdomen is soft, non tender, non distended, no abdominal masses. Liver and spleen not palpable.  No hernias appreciated.  Stool sample for occult testing is not indicated.   GU: No CVA tenderness.  No bladder fullness or masses.  Patient with circumcised/uncircumcised phallus. ***Foreskin easily retracted***  Urethral meatus is patent.  No penile discharge. No penile lesions or rashes. Scrotum without lesions, cysts, rashes and/or edema.  Testicles are located scrotally bilaterally. No masses are appreciated in the testicles. Left and right epididymis are normal. Rectal: Patient with  normal sphincter tone. Anus and perineum without scarring or rashes. No rectal masses are appreciated. Prostate is approximately *** grams, *** nodules are appreciated. Seminal vesicles are normal. Skin: No rashes, bruises or suspicious lesions. Lymph: No cervical or inguinal adenopathy. Neurologic: Grossly intact, no focal deficits, moving all 4 extremities. Psychiatric: Normal mood and  affect.  Laboratory Data: Lab Results  Component Value Date   WBC 6.9 10/09/2014   HGB 12.6 (L) 02/25/2022   HCT 37.0 (L) 02/25/2022   MCV 80.4 10/09/2014   PLT 195 10/09/2014    Lab Results  Component Value Date   CREATININE 1.10 02/25/2022    Urinalysis    Component Value Date/Time   COLORURINE STRAW (A) 01/25/2022 0237   APPEARANCEUR Hazy (A) 02/18/2022 1139   LABSPEC 1.009 01/25/2022 0237   LABSPEC 1.026 03/12/2013 0013   PHURINE 5.0 01/25/2022 0237   GLUCOSEU 3+ (A) 02/18/2022 1139   GLUCOSEU >=500 03/12/2013 0013   HGBUR MODERATE (A) 01/25/2022 0237   BILIRUBINUR Negative 02/18/2022 1139   BILIRUBINUR Negative 03/12/2013 0013   KETONESUR NEGATIVE 01/25/2022 0237   PROTEINUR 1+ (A) 02/18/2022 1139   PROTEINUR NEGATIVE 01/25/2022 0237   NITRITE Positive (A) 02/18/2022 1139   NITRITE NEGATIVE 01/25/2022 0237   LEUKOCYTESUR 1+ (  A) 02/18/2022 1139   LEUKOCYTESUR NEGATIVE 01/25/2022 0237   LEUKOCYTESUR Negative 03/12/2013 0013  I have reviewed the labs.   Pertinent Imaging: N/A  Assessment & Plan:    1. Scrotal rash   No follow-ups on file.  These notes generated with voice recognition software. I apologize for typographical errors.  Ryan, [place] [address]  [place], [place] [address] [phone]

## 2022-04-24 ENCOUNTER — Ambulatory Visit: Payer: Medicare HMO | Admitting: Urology

## 2022-04-24 ENCOUNTER — Encounter: Payer: Self-pay | Admitting: Urology

## 2022-04-24 VITALS — BP 116/76 | HR 91 | Ht 70.0 in | Wt 205.7 lb

## 2022-04-24 DIAGNOSIS — N393 Stress incontinence (female) (male): Secondary | ICD-10-CM | POA: Diagnosis not present

## 2022-04-24 DIAGNOSIS — R21 Rash and other nonspecific skin eruption: Secondary | ICD-10-CM | POA: Diagnosis not present

## 2022-04-24 MED ORDER — TRIAMCINOLONE ACETONIDE 0.025 % EX OINT: 1.0000 | TOPICAL_OINTMENT | Freq: Two times a day (BID) | CUTANEOUS | 0 refills | Status: AC

## 2022-04-24 NOTE — Patient Instructions (Signed)
Erythemic.

## 2022-05-09 ENCOUNTER — Encounter: Payer: Self-pay | Admitting: Physical Therapy

## 2022-05-09 ENCOUNTER — Ambulatory Visit: Payer: Medicare HMO | Attending: Urology | Admitting: Physical Therapy

## 2022-05-09 DIAGNOSIS — R2689 Other abnormalities of gait and mobility: Secondary | ICD-10-CM | POA: Diagnosis present

## 2022-05-09 DIAGNOSIS — M2170 Unequal limb length (acquired), unspecified site: Secondary | ICD-10-CM | POA: Diagnosis present

## 2022-05-09 DIAGNOSIS — N138 Other obstructive and reflux uropathy: Secondary | ICD-10-CM | POA: Diagnosis not present

## 2022-05-09 DIAGNOSIS — R278 Other lack of coordination: Secondary | ICD-10-CM | POA: Insufficient documentation

## 2022-05-09 DIAGNOSIS — N401 Enlarged prostate with lower urinary tract symptoms: Secondary | ICD-10-CM | POA: Insufficient documentation

## 2022-05-09 NOTE — Therapy (Signed)
OUTPATIENT PHYSICAL THERAPY EVALUATION   Patient Name: Andre Edwards MRN: [id number] DOB:Feb 19, 1945, 77 y.o., male Today's Date: 05/09/2022   PT End of Session - 05/09/22 1431     Visit Number 1    Number of Visits 10    PT Start Time [id number]    PT Stop Time 1500    PT Time Calculation (min) 40 min             Past Medical History:  Diagnosis Date   Cataract    Diabetes mellitus without complication ([place])    Enlarged prostate    Hyperlipidemia    Hypertension    Pneumonia 02/11/2013   Sleep apnea    has not worn a CPAP   Past Surgical History:  Procedure Laterality Date   CHOLECYSTECTOMY     CHOLECYSTECTOMY N/A 07/04/2014   Procedure: LAPAROSCOPIC CHOLECYSTECTOMY;  Surgeon: Christene Lye, MD;  Location: [facility];  Service: General;  Laterality: N/A;   EYE SURGERY Bilateral 2015   HOLEP-LASER ENUCLEATION OF THE PROSTATE WITH MORCELLATION N/A 02/25/2022   Procedure: HOLEP-LASER ENUCLEATION OF THE PROSTATE WITH MORCELLATION;  Surgeon: [name], MD;  Location: [facility] OR[facility];  Service: Urology;  Laterality: N/A;   PROSTATE SURGERY N/A    Patient Active Problem List   Diagnosis Date Noted   Hypogonadism in male 09/24/2015   Controlled type 2 diabetes mellitus without complication ([place]) [id number]   Elevated prostate specific antigen (PSA) 08/22/2015   Chronic fatigue 08/22/2015   Benign fibroma of prostate 06/29/2014   Burn 06/29/2014   Diabetes mellitus, type 2 ([place]) 06/29/2014   Abnormal prostate specific antigen 06/29/2014   Allergy to environmental factors 06/29/2014   HLD (hyperlipidemia) 06/29/2014   BP (high blood pressure) 06/29/2014   Stasis, venous 06/29/2014    PCP:  Doy Hutching  REFERRING PROVIDER: Lemar Livings DIAG:  Rationale for Evaluation and Treatment Rehabilitation  THERAPY DIAG:  Other abnormalities of gait and mobility  Leg length inequality  Other lack of coordination  ONSET DATE: Holep 02/25/22   SUBJECTIVE:                                                                                                                                                                                            SUBJECTIVE STATEMENT: 1) not able to feel urge to pee  2) urinary leakage:  changes 2- 3 Diaper and urinary pads per day   3) B knee pain: L worst than R. Walking down ramp hurts 5-6/10 pain   PERTINENT HISTORY:    PAIN:  Are you having pain? Yes: see above   PRECAUTIONS: None  WEIGHT  BEARING RESTRICTIONS: No  FALLS:  Has patient fallen in last 6 months? No  LIVING ENVIRONMENT: Lives with: lives with their spouse Lives in: 2 story  Stairs: Yes    OCCUPATION: Civil engineer, contracting for 46 years   PLOF: Independent  PATIENT GOALS:   Get the urge to urinate and decrease pads   OBJECTIVE:   [facility] PT Assessment - 05/09/22 1433       Observation/Other Assessments   Observations slouched posture      Palpation   SI assessment  L shoulder, L iliac crest higher      Bed Mobility   Bed Mobility --   crunch method with doming along linea alba     Ambulation/Gait   Gait Comments before shoe lift in R shoe : 0.9 m/s ( limpingdecrease R stance phase),  with shoe lift in R shoe ( 1.22 m/s with equal WBing on BLE , less pain in Bknees)               Pelvic Floor Special Questions - 05/09/22 1502     Diastasis Recti 3 fingers width above umbilicus             [facility] Adult PT Treatment/Exercise - 05/09/22 1433       Therapeutic Activites    Therapeutic Activities Other Therapeutic Activities    Other Therapeutic Activities addressed leg length difference with shoe lift , explained structural alignment to help with pelvic issues      Neuro Re-ed    Neuro Re-ed Details  cued for log rolling and sitting posture to minimize straining pelvic floor / DRA              HOME EXERCISE PROGRAM: See pt instruction section    ASSESSMENT:  CLINICAL IMPRESSION:    Pt is a  77  yo  who presents  with decreased urge to urinate, urinary leakage, and B knee pain    which impact QOL, ADL, and community activities.   Pt's musculoskeletal assessment revealed uneven pelvic girdle and shoulder height, asymmetries to gait pattern, limited spinal /pelvic mobility, dyscoordination and strength of pelvic floor mm, hip weakness, poor body mechanics which places strain on the abdominal/pelvic floor mm. These are deficits that indicate an ineffective intraabdominal pressure system associated with increased risk for pt's Sx.   Following Tx today which pt tolerated without complaints, pt demo'd equal alignment of pelvic girdle with shoe lift in R shoe to adjust for leg length difference.  Plan to reassess DRA and add deep core strengthening at next session. Pt benefits from skilled PT.    OBJECTIVE IMPAIRMENTS decreased activity tolerance, decreased coordination, decreased endurance, decreased mobility, difficulty walking, decreased ROM, decreased strength, decreased safety awareness, hypomobility, increased muscle spasms, impaired flexibility, improper body mechanics, postural dysfunction, and pain. scar restrictions   ACTIVITY LIMITATIONS  self-care,  sleep, home chores, work tasks    PARTICIPATION LIMITATIONS:  community,    PERSONAL FACTORS     leg length difference is affecting patient's functional outcome but it is getting addressed with shoe lift  which will help both incontinence and knee pain  REHAB POTENTIAL: Good   CLINICAL DECISION MAKING: Evolving/moderate complexity   EVALUATION COMPLEXITY: Moderate    PATIENT EDUCATION:    Education details: Showed pt anatomy images. Explained muscles attachments/ connection, physiology of deep core system/ spinal- thoracic-pelvis-lower kinetic chain as they relate to pt's presentation, Sx, and past Hx. Explained what and how these areas of deficits need to be  restored to balance and function    See Therapeutic activity / neuromuscular re-education  section  Answered pt's questions.   Person educated: Patient Education method: Explanation, Demonstration, Tactile cues, Verbal cues, and Handouts Education comprehension: verbalized understanding, returned demonstration, verbal cues required, tactile cues required, and needs further education     PLAN: PT FREQUENCY: 1x/week   PT DURATION: 10 weeks   PLANNED INTERVENTIONS: Therapeutic exercises, Therapeutic activity, Neuromuscular re-education, Balance training, Gait training, Patient/Family education, Self Care, Joint mobilization, Spinal mobilization, Moist heat, Taping, and Manual therapy, dry needling.   PLAN FOR NEXT SESSION: See clinical impression for plan     GOALS: Goals reviewed with patient? Yes  SHORT TERM GOALS: Target date: 06/06/2022    Pt will demo IND with HEP                    Baseline: Not IND            Goal status: INITIAL   LONG TERM GOALS: Target date: 07/18/2022    1.Pt will demo proper deep core coordination without chest breathing and optimal excursion of diaphragm/pelvic floor in order to promote spinal stability and pelvic floor function  Baseline: dyscoordination Goal status: INITIAL  2.  Pt will demo > 5 pt change on FOTO  to improve QOL and function  PFDI Urinary baseline - 25 Lower score = better function  Urinary Problem baseline-  40 Higher score = better function   Goal status: INITIAL  3.  Pt will demo proper body mechanics in against gravity tasks and ADLs  work tasks, fitness  to minimize straining pelvic floor / back                  Baseline: not IND, improper form that places strain on pelvic floor                Goal status: INITIAL    4. Pt will demo increased gait speed > 1.3 m/s in order to ambulate safely in community and return to fitness routine  Baseline: 0.9 m/s ( limpingdecrease R stance phase),   ( with shoe lift in R shoe , 1.22 m/s)  Goal status: INITIAL    5. Pt will demo levelled pelvic girdle and  shoulder height in order to progress to deep core strengthening HEP and restore mobility at spine, pelvis, gait, posture   Baseline:  L shoulder and iliac crest was higher  Goal status: INITIAL   6. Pt will decrease to 1 diaper / urinary pads  Baseline:  2- 3 Diaper and urinary pads per day  Goal status: INITIAL                           7.  Pt will demo decreased separation of diastasis to < 2 fingers width in order to promote stronger intraabdominal pressure system for spinal / postural stability and pelvic floor function                 Baseline: 3 FINGERS WIDTH above umbilicus    Goal status: INITIAL   Jerl Mina, PT 05/09/2022, 2:38 PM

## 2022-05-09 NOTE — Patient Instructions (Signed)
Avoid straining pelvic floor, abdominal muscles , spine  Use log rolling technique instead of getting out of bed with your neck or the sit-up     Log rolling into and out of bed   Log rolling into and out of bed If getting out of bed on R side, Bent knees, scoot hips/ shoulder to L  Raise R arm completely overhead, rolling onto armpit  Then lower bent knees to bed to get into complete side lying position  Then drop legs off bed, and push up onto R elbow/forearm, and use L hand to push onto the bed  __   Proper body mechanics with getting out of a chair to decrease strain  on back &pelvic floor   Avoid holding your breath when Getting out of the chair:  Scoot to front part of chair chair Heels behind knees, feet are hip width apart, nose over toes  Inhale like you are smelling roses Exhale to stand   __  Sitting with feet on ground, four points of contact Catch yourself crossing ankles and thighs  __   Try Shoe lift in R shoe,   remove if causes pain

## 2022-05-15 ENCOUNTER — Ambulatory Visit: Payer: Medicare HMO | Attending: Urology | Admitting: Physical Therapy

## 2022-05-15 DIAGNOSIS — R2689 Other abnormalities of gait and mobility: Secondary | ICD-10-CM

## 2022-05-15 DIAGNOSIS — R278 Other lack of coordination: Secondary | ICD-10-CM | POA: Diagnosis present

## 2022-05-15 DIAGNOSIS — M2170 Unequal limb length (acquired), unspecified site: Secondary | ICD-10-CM | POA: Diagnosis present

## 2022-05-15 NOTE — Patient Instructions (Addendum)
  ____  Neck / shoulder stretches:    Lying on back - small sushi roll towel under neck  _ 6 directions  5 reps   __     Brushing arm with 3/4 turn onto pillow behind back  Lying on R  side ,Pillow/ Block between knees     dragging top forearm across ribs below breast rotating 3/4 turn,  rotating  _L_ only this week ,  relax onto the pillow behind the back  and then back to other palm , maintain top palm on body whole top and not lift shoulder  ___    Brushing arm with 3/4 turn onto pillow behind back  Lying on L  side ,Pillow/ Block between knees     dragging top forearm across ribs below breast rotating 3/4 turn,  rotating  _R_ only this week ,  relax onto the pillow behind the back  and then back to other palm , maintain top palm on body whole top and not lift shoulder  __  Wider feet when getting up from sitting,

## 2022-05-15 NOTE — Therapy (Signed)
OUTPATIENT PHYSICAL THERAPY Treatment    Patient Name: Andre Edwards MRN: [id number] DOB:12/27/1945, 77 y.o., male Today's Date: 05/15/2022   PT End of Session - 05/15/22 1013     Visit Number 2    Number of Visits 10    PT Start Time 0933    PT Stop Time 1015    PT Time Calculation (min) 42 min             Past Medical History:  Diagnosis Date   Cataract    Diabetes mellitus without complication ([place])    Enlarged prostate    Hyperlipidemia    Hypertension    Pneumonia 02/11/2013   Sleep apnea    has not worn a CPAP   Past Surgical History:  Procedure Laterality Date   CHOLECYSTECTOMY     CHOLECYSTECTOMY N/A 07/04/2014   Procedure: LAPAROSCOPIC CHOLECYSTECTOMY;  Surgeon: Christene Lye, MD;  Location: [facility];  Service: General;  Laterality: N/A;   EYE SURGERY Bilateral 2015   HOLEP-LASER ENUCLEATION OF THE PROSTATE WITH MORCELLATION N/A 02/25/2022   Procedure: HOLEP-LASER ENUCLEATION OF THE PROSTATE WITH MORCELLATION;  Surgeon: [name], MD;  Location: [facility];  Service: Urology;  Laterality: N/A;   PROSTATE SURGERY N/A    Patient Active Problem List   Diagnosis Date Noted   Hypogonadism in male 09/24/2015   Controlled type 2 diabetes mellitus without complication [id number]   Elevated prostate specific antigen (PSA) 08/22/2015   Chronic fatigue 08/22/2015   Benign fibroma of prostate 06/29/2014   Burn 06/29/2014   Diabetes mellitus, type 2 06/29/2014   Abnormal prostate specific antigen 06/29/2014   Allergy to environmental factors 06/29/2014   HLD (hyperlipidemia) 06/29/2014   BP (high blood pressure) 06/29/2014   Stasis, venous 06/29/2014    PCP:  Doy Hutching  REFERRING PROVIDER: Lemar Livings DIAG:  Rationale for Evaluation and Treatment Rehabilitation  THERAPY DIAG:  Other abnormalities of gait and mobility  Leg length inequality  Other lack of coordination  ONSET DATE: Holep 02/25/22   SUBJECTIVE:                                                                                                                                                                                            SUBJECTIVE STATEMENT today  Pt reported the shoe lift in R shoe has helped with knee pain 70-80%  He noticed 25% less leakage last week   SUBJECTIVE STATEMENT on EVAL 05/09/22 : 1) not able to feel urge to pee  2) urinary leakage:  changes 2- 3 Diaper and urinary pads per day   3) B knee pain: L  worst than R. Walking down ramp hurts 5-6/10 pain   PERTINENT HISTORY:    PAIN:  Are you having pain? Yes: see above   PRECAUTIONS: None  WEIGHT BEARING RESTRICTIONS: No  FALLS:  Has patient fallen in last 6 months? No  LIVING ENVIRONMENT: Lives with: lives with their spouse Lives in: 2 story  Stairs: Yes    OCCUPATION: Civil engineer, contracting for 46 years   PLOF: Independent  PATIENT GOALS:   Get the urge to urinate and decrease pads   OBJECTIVE:     [facility] PT Assessment - 05/15/22 1015       Coordination   Coordination and Movement Description chest breathing      Palpation   Spinal mobility hypomobile  C/T junction, T/L junction    Palpation comment tightness along medail scapula, intercostals, paraspinals             [facility] Adult PT Treatment/Exercise - 05/15/22 1017       Neuro Re-ed    Neuro Re-ed Details  cued for log rolling  and less chest breathing ( withheld deep core training today), cued for stretches for more spinal mobility      Manual Therapy   Manual therapy comments STM/MWM at problem areas noted in assessment to promote diaphragmatic excursion and psinal mobility               HOME EXERCISE PROGRAM: See pt instruction section    ASSESSMENT:  CLINICAL IMPRESSION:    Improvements attributed to pelvis alignment being corrected with shoe lift in R shoe:  Pt reported the shoe lift in R shoe has helped with knee pain 70-80%  He noticed 25% less leakage last week  Today, pt  showed he is not ready for deep core training due to chest breathing and hypomobility along spine at C/T T/L junction. Manual Tx was applied to increase mobility after which he demo'd improve diaphragmatic excursion. HEP included neck and thoracic mobility. Cued for HEP.  Plan to progress to dep core training next session.     Pt benefits from skilled PT.    OBJECTIVE IMPAIRMENTS decreased activity tolerance, decreased coordination, decreased endurance, decreased mobility, difficulty walking, decreased ROM, decreased strength, decreased safety awareness, hypomobility, increased muscle spasms, impaired flexibility, improper body mechanics, postural dysfunction, and pain. scar restrictions   ACTIVITY LIMITATIONS  self-care,  sleep, home chores, work tasks    PARTICIPATION LIMITATIONS:  community,    PERSONAL FACTORS     leg length difference is affecting patient's functional outcome but it is getting addressed with shoe lift  which will help both incontinence and knee pain  REHAB POTENTIAL: Good   CLINICAL DECISION MAKING: Evolving/moderate complexity   EVALUATION COMPLEXITY: Moderate    PATIENT EDUCATION:    Education details: Showed pt anatomy images. Explained muscles attachments/ connection, physiology of deep core system/ spinal- thoracic-pelvis-lower kinetic chain as they relate to pt's presentation, Sx, and past Hx. Explained what and how these areas of deficits need to be restored to balance and function    See Therapeutic activity / neuromuscular re-education section  Answered pt's questions.   Person educated: Patient Education method: Explanation, Demonstration, Tactile cues, Verbal cues, and Handouts Education comprehension: verbalized understanding, returned demonstration, verbal cues required, tactile cues required, and needs further education     PLAN: PT FREQUENCY: 1x/week   PT DURATION: 10 weeks   PLANNED INTERVENTIONS: Therapeutic exercises, Therapeutic activity,  Neuromuscular re-education, Balance training, Gait training, Patient/Family education, Self Care, Joint mobilization, Spinal  mobilization, Moist heat, Taping, and Manual therapy, dry needling.   PLAN FOR NEXT SESSION: See clinical impression for plan     GOALS: Goals reviewed with patient? Yes  SHORT TERM GOALS: Target date: 06/06/2022    Pt will demo IND with HEP                    Baseline: Not IND            Goal status: INITIAL   LONG TERM GOALS: Target date: 07/18/2022    1.Pt will demo proper deep core coordination without chest breathing and optimal excursion of diaphragm/pelvic floor in order to promote spinal stability and pelvic floor function  Baseline: dyscoordination Goal status: INITIAL  2.  Pt will demo > 5 pt change on FOTO  to improve QOL and function  PFDI Urinary baseline - 25 Lower score = better function  Urinary Problem baseline-  40 Higher score = better function   Goal status: INITIAL  3.  Pt will demo proper body mechanics in against gravity tasks and ADLs  work tasks, fitness  to minimize straining pelvic floor / back                  Baseline: not IND, improper form that places strain on pelvic floor                Goal status: INITIAL    4. Pt will demo increased gait speed > 1.3 m/s in order to ambulate safely in community and return to fitness routine  Baseline: 0.9 m/s ( limpingdecrease R stance phase),   ( with shoe lift in R shoe , 1.22 m/s)  Goal status: INITIAL    5. Pt will demo levelled pelvic girdle and shoulder height in order to progress to deep core strengthening HEP and restore mobility at spine, pelvis, gait, posture   Baseline:  L shoulder and iliac crest was higher  Goal status: INITIAL   6. Pt will decrease to 1 diaper / urinary pads  Baseline:  2- 3 Diaper and urinary pads per day  Goal status: INITIAL                           7.  Pt will demo decreased separation of diastasis to < 2 fingers width in order to  promote stronger intraabdominal pressure system for spinal / postural stability and pelvic floor function                 Baseline: 3 FINGERS WIDTH above umbilicus    Goal status: INITIAL   Jerl Mina, PT 05/15/2022, 10:14 AM

## 2022-05-22 ENCOUNTER — Ambulatory Visit: Payer: Medicare HMO | Admitting: Physical Therapy

## 2022-05-22 DIAGNOSIS — R278 Other lack of coordination: Secondary | ICD-10-CM

## 2022-05-22 DIAGNOSIS — R2689 Other abnormalities of gait and mobility: Secondary | ICD-10-CM | POA: Diagnosis not present

## 2022-05-22 DIAGNOSIS — M2170 Unequal limb length (acquired), unspecified site: Secondary | ICD-10-CM

## 2022-05-22 NOTE — Therapy (Addendum)
OUTPATIENT PHYSICAL THERAPY Treatment    Patient Name: Andre Edwards MRN: [id number] DOB:[birth year], 77 y.o., male Today's Date: 05/22/2022   PT End of Session - 05/22/22 0940     Visit Number 3    Number of Visits 10    PT Start Time [phone]    PT Stop Time 1020    PT Time Calculation (min) 43 min             Past Medical History:  Diagnosis Date   Cataract    Diabetes mellitus without complication (HCC)    Enlarged prostate    Hyperlipidemia    Hypertension    Pneumonia 02/11/2013   Sleep apnea    has not worn a CPAP   Past Surgical History:  Procedure Laterality Date   CHOLECYSTECTOMY     CHOLECYSTECTOMY N/A 07/04/2014   Procedure: LAPAROSCOPIC CHOLECYSTECTOMY;  Surgeon: Kieth Brightly, MD;  Location: [facility];  Service: General;  Laterality: N/A;   EYE SURGERY Bilateral 2015   HOLEP-LASER ENUCLEATION OF THE PROSTATE WITH MORCELLATION N/A 02/25/2022   Procedure: HOLEP-LASER ENUCLEATION OF THE PROSTATE WITH MORCELLATION;  Surgeon: Vanna Scotland, MD;  Location: [facility];  Service: Urology;  Laterality: N/A;   PROSTATE SURGERY N/A    Patient Active Problem List   Diagnosis Date Noted   Hypogonadism in male 09/24/2015   Controlled type 2 diabetes mellitus without complication 08/22/2015   Elevated prostate specific antigen (PSA) 08/22/2015   Chronic fatigue 08/22/2015   Benign fibroma of prostate 06/29/2014   Burn 06/29/2014   Diabetes mellitus, type 2 06/29/2014   Abnormal prostate specific antigen 06/29/2014   Allergy to environmental factors 06/29/2014   HLD (hyperlipidemia) 06/29/2014   BP (high blood pressure) 06/29/2014   Stasis, venous 06/29/2014    PCP:  Judithann Sheen  REFERRING PROVIDER: Lurlean Horns DIAG:  Rationale for Evaluation and Treatment Rehabilitation  THERAPY DIAG:  Other abnormalities of gait and mobility  Leg length inequality  Other lack of coordination  ONSET DATE: Holep 02/25/22   SUBJECTIVE:                                                                                                                                                                                            SUBJECTIVE STATEMENT today  Pt reported 70-80% less knee pain with sit to stand,.  He feels he can turn his neck  more when driving    SUBJECTIVE STATEMENT on EVAL 05/09/22 : 1) not able to feel urge to pee  2) urinary leakage:  changes 2- 3 Diaper and urinary pads per day   3) B knee pain:  L worst than R. Walking down ramp hurts 5-6/10 pain   PERTINENT HISTORY:    PAIN:  Are you having pain? Yes: see above   PRECAUTIONS: None  WEIGHT BEARING RESTRICTIONS: No  FALLS:  Has patient fallen in last 6 months? No  LIVING ENVIRONMENT: Lives with: lives with their spouse Lives in: 2 story  Stairs: Yes    OCCUPATION: Copywriter, [employer] worker for 46 years   PLOF: Independent  PATIENT GOALS:   Get the urge to urinate and decrease pads   OBJECTIVE:   [facility] - 05/22/22 0942       Strength   Overall Strength Comments B hip abd  4/5      Palpation   SI assessment  levelled pelvis and shoulder ,less slouched posture    Palpation comment tenderness along interspinals, hypomobile C/T junction deviated to L  , medial scapula mm tightness L , lower posterior intercostal mm tightness              [facility] Adult PT Treatment/Exercise        Neuro Re-ed    Neuro Re-ed Details  reviewed HEP      Modalities   Modalities Moist Heat      Moist Heat Therapy   Moist Heat Location --   unbilled ( cervical )     Manual Therapy   Manual therapy comments STM/MWM at problem areas noted in assessment to promote diaphragmatic excursion and spinal mobility                  HOME EXERCISE PROGRAM: See pt instruction section    ASSESSMENT:  CLINICAL IMPRESSION:    Improvements attributed to pelvis alignment being corrected with shoe lift in R shoe for the past 2 weeks: - levelled pelvis  -pt reports  ability to neck his neck with driving and decreased knee pain by 70-80% with sit to stand    Today, pt showed he is still not ready for deep core training due to chest breathing and continue to require manual Tx to improve  hypomobility along spine at C/T T/L junction on L side. Manual Tx was applied to increase mobility after which he demo'd improve diaphragmatic excursion. HEP included neck and thoracic mobility. Cued for HEP.  Plan to progress to dep core training next session.     Pt benefits from skilled PT.    OBJECTIVE IMPAIRMENTS decreased activity tolerance, decreased coordination, decreased endurance, decreased mobility, difficulty walking, decreased ROM, decreased strength, decreased safety awareness, hypomobility, increased muscle spasms, impaired flexibility, improper body mechanics, postural dysfunction, and pain. scar restrictions   ACTIVITY LIMITATIONS  self-care,  sleep, home chores, work tasks    PARTICIPATION LIMITATIONS:  community,    PERSONAL FACTORS     leg length difference is affecting patient's functional outcome but it is getting addressed with shoe lift  which will help both incontinence and knee pain  REHAB POTENTIAL: Good   CLINICAL DECISION MAKING: Evolving/moderate complexity   EVALUATION COMPLEXITY: Moderate    PATIENT EDUCATION:    Education details: Showed pt anatomy images. Explained muscles attachments/ connection, physiology of deep core system/ spinal- thoracic-pelvis-lower kinetic chain as they relate to pt's presentation, Sx, and past Hx. Explained what and how these areas of deficits need to be restored to balance and function    See Therapeutic activity / neuromuscular re-education section  Answered pt's questions.   Person educated: Patient Education method: Explanation, Demonstration, Tactile cues, Verbal cues, and Handouts Education comprehension:  verbalized understanding, returned demonstration, verbal cues required, tactile cues  required, and needs further education     PLAN: PT FREQUENCY: 1x/week   PT DURATION: 10 weeks   PLANNED INTERVENTIONS: Therapeutic exercises, Therapeutic activity, Neuromuscular re-education, Balance training, Gait training, Patient/Family education, Self Care, Joint mobilization, Spinal mobilization, Moist heat, Taping, and Manual therapy, dry needling.   PLAN FOR NEXT SESSION: See clinical impression for plan     GOALS: Goals reviewed with patient? Yes  SHORT TERM GOALS: Target date: 06/06/2022    Pt will demo IND with HEP                    Baseline: Not IND            Goal status: INITIAL   LONG TERM GOALS: Target date: 07/18/2022    1.Pt will demo proper deep core coordination without chest breathing and optimal excursion of diaphragm/pelvic floor in order to promote spinal stability and pelvic floor function  Baseline: dyscoordination Goal status: INITIAL  2.  Pt will demo > 5 pt change on FOTO  to improve QOL and function  PFDI Urinary baseline - 25 Lower score = better function  Urinary Problem baseline-  40 Higher score = better function   Goal status: INITIAL  3.  Pt will demo proper body mechanics in against gravity tasks and ADLs  work tasks, fitness  to minimize straining pelvic floor / back                  Baseline: not IND, improper form that places strain on pelvic floor                Goal status: INITIAL    4. Pt will demo increased gait speed > 1.3 m/s in order to ambulate safely in community and return to fitness routine  Baseline: 0.9 m/s ( limpingdecrease R stance phase),   ( with shoe lift in R shoe , 1.22 m/s)  Goal status: INITIAL    5. Pt will demo levelled pelvic girdle and shoulder height in order to progress to deep core strengthening HEP and restore mobility at spine, pelvis, gait, posture   Baseline:  L shoulder and iliac crest was higher  Goal status: INITIAL   6. Pt will decrease to 1 diaper / urinary pads  Baseline:  2- 3  Diaper and urinary pads per day  Goal status: INITIAL                           7.  Pt will demo decreased separation of diastasis to < 2 fingers width in order to promote stronger intraabdominal pressure system for spinal / postural stability and pelvic floor function                 Baseline: 3 FINGERS WIDTH above umbilicus    Goal status: INITIAL   Mariane Masters, PT 05/22/2022, 10:21 AM

## 2022-05-29 ENCOUNTER — Ambulatory Visit: Payer: Medicare HMO | Admitting: Physical Therapy

## 2022-05-29 DIAGNOSIS — R2689 Other abnormalities of gait and mobility: Secondary | ICD-10-CM | POA: Diagnosis not present

## 2022-05-29 DIAGNOSIS — R278 Other lack of coordination: Secondary | ICD-10-CM

## 2022-05-29 DIAGNOSIS — M2170 Unequal limb length (acquired), unspecified site: Secondary | ICD-10-CM

## 2022-05-29 NOTE — Therapy (Signed)
OUTPATIENT PHYSICAL THERAPY Treatment    Patient Name: Andre Edwards MRN: [id number] DOB:1945-10-29, 77 y.o., male Today's Date: 05/29/2022   PT End of Session - 05/29/22 0946     Visit Number 4    Number of Visits 10    PT Start Time [phone]    PT Stop Time 1015    PT Time Calculation (min) 38 min    Activity Tolerance Patient tolerated treatment well    Behavior During Therapy [facility] for tasks assessed/performed             Past Medical History:  Diagnosis Date   Cataract    Diabetes mellitus without complication (HCC)    Enlarged prostate    Hyperlipidemia    Hypertension    Pneumonia 02/11/2013   Sleep apnea    has not worn a CPAP   Past Surgical History:  Procedure Laterality Date   CHOLECYSTECTOMY     CHOLECYSTECTOMY N/A 07/04/2014   Procedure: LAPAROSCOPIC CHOLECYSTECTOMY;  Surgeon: Kieth Brightly, MD;  Location: [facility];  Service: General;  Laterality: N/A;   EYE SURGERY Bilateral 2015   HOLEP-LASER ENUCLEATION OF THE PROSTATE WITH MORCELLATION N/A 02/25/2022   Procedure: HOLEP-LASER ENUCLEATION OF THE PROSTATE WITH MORCELLATION;  Surgeon: Vanna Scotland, MD;  Location: [facility];  Service: Urology;  Laterality: N/A;   PROSTATE SURGERY N/A    Patient Active Problem List   Diagnosis Date Noted   Hypogonadism in male 09/24/2015   Controlled type 2 diabetes mellitus without complication 08/22/2015   Elevated prostate specific antigen (PSA) 08/22/2015   Chronic fatigue 08/22/2015   Benign fibroma of prostate 06/29/2014   Burn 06/29/2014   Diabetes mellitus, type 2 06/29/2014   Abnormal prostate specific antigen 06/29/2014   Allergy to environmental factors 06/29/2014   HLD (hyperlipidemia) 06/29/2014   BP (high blood pressure) 06/29/2014   Stasis, venous 06/29/2014    PCP:  Judithann Sheen  REFERRING PROVIDER: Lurlean Horns DIAG:  Rationale for Evaluation and Treatment Rehabilitation  THERAPY DIAG:  Other abnormalities of gait and mobility  Leg  length inequality  Other lack of coordination  ONSET DATE: Holep 02/25/22   SUBJECTIVE:                                                                                                                                                                                           SUBJECTIVE STATEMENT today  Pt reported he is not changing as many pads a day. Pt is now changing 2-3 x a day and they are not as soaked.  Pt is not changing his urinary pads within his Depends as often as he  was.    Pt is sensing the urge to urinate once every 2 hours. Pt drink 8 oz coffee daily, 8 oz juice daily ,  12 oz pepsi sometimes . Pt drinks 48 fl oz water a day.     SUBJECTIVE STATEMENT on EVAL 05/09/22 : 1) not able to feel urge to pee  2) urinary leakage:  changes 2- 3 Diaper and urinary pads per day   3) B knee pain: L worst than R. Walking down ramp hurts 5-6/10 pain   PERTINENT HISTORY:    PAIN:  Are you having pain? Yes: see above   PRECAUTIONS: None  WEIGHT BEARING RESTRICTIONS: No  FALLS:  Has patient fallen in last 6 months? No  LIVING ENVIRONMENT: Lives with: lives with their spouse Lives in: 2 story  Stairs: Yes    OCCUPATION: Copywriter, advertising for 46 years   PLOF: Independent  PATIENT GOALS:   Get the urge to urinate and decrease pads   OBJECTIVE:    [facility] PT Assessment - 05/29/22 2239       Palpation   Palpation comment tenderness along interspinals, hypomobile C/T junction deviated to L  , cervical mm , lower posterior intercostal mm tightness                 HOME EXERCISE PROGRAM: See pt instruction section    ASSESSMENT:  CLINICAL IMPRESSION:    Improvements attributed to pelvis alignment being corrected with shoe lift in R shoe for the past 3 weeks: - levelled pelvis  -pt reports ability to neck his neck with driving  -decreased knee pain by 70-80%   Improvements related to urinary issues this past week: -he is not changing as many pads a day.  Pt is now changing 2-3 x a day and they are not as soaked.  Pt is not changing his urinary pads within his    Depends as often  - Pt is sensing the urge to urinate once every 2 hours  Continued with manual Tx to realign C/T junction after which helped with optimal diaphragmatic breathing which renders him ready for deep core training next session. Pt demo'd less chest breathing post Tx. Pt tolerated Tx without pain.   Plan to also add quad strengthening next session.  Pt benefits from skilled PT.    OBJECTIVE IMPAIRMENTS decreased activity tolerance, decreased coordination, decreased endurance, decreased mobility, difficulty walking, decreased ROM, decreased strength, decreased safety awareness, hypomobility, increased muscle spasms, impaired flexibility, improper body mechanics, postural dysfunction, and pain. scar restrictions   ACTIVITY LIMITATIONS  self-care,  sleep, home chores, work tasks    PARTICIPATION LIMITATIONS:  community,    PERSONAL FACTORS     leg length difference is affecting patient's functional outcome but it is getting addressed with shoe lift  which will help both incontinence and knee pain  REHAB POTENTIAL: Good   CLINICAL DECISION MAKING: Evolving/moderate complexity   EVALUATION COMPLEXITY: Moderate    PATIENT EDUCATION:    Education details: Showed pt anatomy images. Explained muscles attachments/ connection, physiology of deep core system/ spinal- thoracic-pelvis-lower kinetic chain as they relate to pt's presentation, Sx, and past Hx. Explained what and how these areas of deficits need to be restored to balance and function    See Therapeutic activity / neuromuscular re-education section  Answered pt's questions.   Person educated: Patient Education method: Explanation, Demonstration, Tactile cues, Verbal cues, and Handouts Education comprehension: verbalized understanding, returned demonstration, verbal cues required, tactile cues required, and needs  further education     PLAN: PT FREQUENCY: 1x/week   PT DURATION: 10 weeks   PLANNED INTERVENTIONS: Therapeutic exercises, Therapeutic activity, Neuromuscular re-education, Balance training, Gait training, Patient/Family education, Self Care, Joint mobilization, Spinal mobilization, Moist heat, Taping, and Manual therapy, dry needling.   PLAN FOR NEXT SESSION: See clinical impression for plan     GOALS: Goals reviewed with patient? Yes  SHORT TERM GOALS: Target date: 06/06/2022    Pt will demo IND with HEP                    Baseline: Not IND            Goal status: INITIAL   LONG TERM GOALS: Target date: 07/18/2022    1.Pt will demo proper deep core coordination without chest breathing and optimal excursion of diaphragm/pelvic floor in order to promote spinal stability and pelvic floor function  Baseline: dyscoordination Goal status: INITIAL  2.  Pt will demo > 5 pt change on FOTO  to improve QOL and function  PFDI Urinary baseline - 25 Lower score = better function  Urinary Problem baseline-  40 Higher score = better function   Goal status: INITIAL  3.  Pt will demo proper body mechanics in against gravity tasks and ADLs  work tasks, fitness  to minimize straining pelvic floor / back                  Baseline: not IND, improper form that places strain on pelvic floor                Goal status: INITIAL    4. Pt will demo increased gait speed > 1.3 m/s in order to ambulate safely in community and return to fitness routine  Baseline: 0.9 m/s ( limpingdecrease R stance phase),   ( with shoe lift in R shoe , 1.22 m/s)  Goal status: INITIAL    5. Pt will demo levelled pelvic girdle and shoulder height in order to progress to deep core strengthening HEP and restore mobility at spine, pelvis, gait, posture   Baseline:  L shoulder and iliac crest was higher  Goal status: INITIAL   6. Pt will decrease to 1 diaper / urinary pads  Baseline:  2- 3 Diaper and urinary  pads per day  Goal status: INITIAL                           7.  Pt will demo decreased separation of diastasis to < 2 fingers width in order to promote stronger intraabdominal pressure system for spinal / postural stability and pelvic floor function                 Baseline: 3 FINGERS WIDTH above umbilicus    Goal status: INITIAL   Mariane Masters, PT 05/29/2022, 9:50 AM

## 2022-05-29 NOTE — Patient Instructions (Signed)
   Increase water from 48 fl oz to 64 floz  This week  Use tumblers to ensure exact amounts consistently  To ensure avoiding dehydration  , esp on days [place]  It ok to maintain   8 oz coffee daily, 8 oz juice daily ,  12 oz pepsi sometimes .  ___

## 2022-06-05 ENCOUNTER — Ambulatory Visit: Payer: Medicare HMO | Admitting: Physical Therapy

## 2022-06-05 DIAGNOSIS — R2689 Other abnormalities of gait and mobility: Secondary | ICD-10-CM

## 2022-06-05 DIAGNOSIS — R278 Other lack of coordination: Secondary | ICD-10-CM

## 2022-06-05 DIAGNOSIS — M2170 Unequal limb length (acquired), unspecified site: Secondary | ICD-10-CM

## 2022-06-05 NOTE — Patient Instructions (Signed)
Complete incontinence / urge  chart

## 2022-06-05 NOTE — Therapy (Signed)
OUTPATIENT PHYSICAL THERAPY Treatment    Patient Name: Andre Edwards MRN: [id number] DOB:[birth year], 77 y.o., male Today's Date: 06/05/2022   PT End of Session - 06/05/22 0943     Visit Number 5    Number of Visits 10    PT Start Time [phone]    PT Stop Time 1015    PT Time Calculation (min) 38 min    Activity Tolerance Patient tolerated treatment well    Behavior During Therapy [facility] for tasks assessed/performed             Past Medical History:  Diagnosis Date   Cataract    Diabetes mellitus without complication (HCC)    Enlarged prostate    Hyperlipidemia    Hypertension    Pneumonia 02/11/2013   Sleep apnea    has not worn a CPAP   Past Surgical History:  Procedure Laterality Date   CHOLECYSTECTOMY     CHOLECYSTECTOMY N/A 07/04/2014   Procedure: LAPAROSCOPIC CHOLECYSTECTOMY;  Surgeon: Kieth Brightly, MD;  Location: [facility] ORS;  Service: General;  Laterality: N/A;   EYE SURGERY Bilateral 2015   HOLEP-LASER ENUCLEATION OF THE PROSTATE WITH MORCELLATION N/A 02/25/2022   Procedure: HOLEP-LASER ENUCLEATION OF THE PROSTATE WITH MORCELLATION;  Surgeon: Vanna Scotland, MD;  Location: [facility] OR[facility];  Service: Urology;  Laterality: N/A;   PROSTATE SURGERY N/A    Patient Active Problem List   Diagnosis Date Noted   Hypogonadism in male 09/24/2015   Controlled type 2 diabetes mellitus without complication 08/22/2015   Elevated prostate specific antigen (PSA) 08/22/2015   Chronic fatigue 08/22/2015   Benign fibroma of prostate 06/29/2014   Burn 06/29/2014   Diabetes mellitus, type 2 06/29/2014   Abnormal prostate specific antigen 06/29/2014   Allergy to environmental factors 06/29/2014   HLD (hyperlipidemia) 06/29/2014   BP (high blood pressure) 06/29/2014   Stasis, venous 06/29/2014    PCP:  Judithann Sheen  REFERRING PROVIDER: Lurlean Horns DIAG:  Rationale for Evaluation and Treatment Rehabilitation  THERAPY DIAG:  Other abnormalities of gait and mobility  Leg  length inequality  Other lack of coordination  ONSET DATE: Holep 02/25/22   SUBJECTIVE:                                                                                                                                                                                           SUBJECTIVE STATEMENT today  Pt reported he is able to turn his neck more. Urge is still a problem . Pt wore a cathether for 2 months prior to surgery   SUBJECTIVE STATEMENT on EVAL 05/09/22 : 1) not able to feel urge  to pee  2) urinary leakage:  changes 2- 3 Diaper and urinary pads per day   3) B knee pain: L worst than R. Walking down ramp hurts 5-6/10 pain   PERTINENT HISTORY:    PAIN:  Are you having pain? Yes: see above   PRECAUTIONS: None  WEIGHT BEARING RESTRICTIONS: No  FALLS:  Has patient fallen in last 6 months? No  LIVING ENVIRONMENT: Lives with: lives with their spouse Lives in: 2 story  Stairs: Yes    OCCUPATION: Copywriter, advertising for 46 years   PLOF: Independent  PATIENT GOALS:   Get the urge to urinate and decrease pads   OBJECTIVE:    [facility] PT Assessment - 05/29/22 2239       Palpation   Palpation comment tenderness along interspinals, hypomobile C/T junction deviated to L  , cervical mm , lower posterior intercostal mm tightness             [facility] Adult PT Treatment/Exercise - 06/05/22 1055       Therapeutic Activites    Other Therapeutic Activities discussed completing bladder chart , tracking urge , water intake , leakage      Neuro Re-ed    Neuro Re-ed Details  cued for fascial releases over low ab with lower trunk rotation      Modalities   Modalities Moist Heat      Moist Heat Therapy   Moist Heat Location --   5 min ( unbilled)     Manual Therapy   Manual therapy comments STM/MWM at problem areas noted in assessment to promote diaphragmatic excursion and spinal mobility                 HOME EXERCISE PROGRAM: See pt instruction section     ASSESSMENT:  CLINICAL IMPRESSION:     Continued with manual Tx to realign C/T junction after which helped with optimal diaphragmatic breathing. Pt demo'd less chest breathing and more medially aligned C/T spine post Tx. Pt tolerated Tx without pain.  Plan for deep core training next session.   Urge is still a problem . Pt wore a cathether for 2 months prior to surgery. Plan to apply fascial releases over low ab next session but this was given as HEP. Pt demo'd correctly.  Provided bladder chart today for tracking urge , water intake , leakage.    Plan to also add quad strengthening at upcoming sessions.  Pt benefits from skilled PT.    OBJECTIVE IMPAIRMENTS decreased activity tolerance, decreased coordination, decreased endurance, decreased mobility, difficulty walking, decreased ROM, decreased strength, decreased safety awareness, hypomobility, increased muscle spasms, impaired flexibility, improper body mechanics, postural dysfunction, and pain. scar restrictions   ACTIVITY LIMITATIONS  self-care,  sleep, home chores, work tasks    PARTICIPATION LIMITATIONS:  community,    PERSONAL FACTORS     leg length difference is affecting patient's functional outcome but it is getting addressed with shoe lift  which will help both incontinence and knee pain  REHAB POTENTIAL: Good   CLINICAL DECISION MAKING: Evolving/moderate complexity   EVALUATION COMPLEXITY: Moderate    PATIENT EDUCATION:    Education details: Showed pt anatomy images. Explained muscles attachments/ connection, physiology of deep core system/ spinal- thoracic-pelvis-lower kinetic chain as they relate to pt's presentation, Sx, and past Hx. Explained what and how these areas of deficits need to be restored to balance and function    See Therapeutic activity / neuromuscular re-education section  Answered pt's questions.   Person  educated: Patient Education method: Explanation, Demonstration, Tactile cues, Verbal  cues, and Handouts Education comprehension: verbalized understanding, returned demonstration, verbal cues required, tactile cues required, and needs further education     PLAN: PT FREQUENCY: 1x/week   PT DURATION: 10 weeks   PLANNED INTERVENTIONS: Therapeutic exercises, Therapeutic activity, Neuromuscular re-education, Balance training, Gait training, Patient/Family education, Self Care, Joint mobilization, Spinal mobilization, Moist heat, Taping, and Manual therapy, dry needling.   PLAN FOR NEXT SESSION: See clinical impression for plan     GOALS: Goals reviewed with patient? Yes  SHORT TERM GOALS: Target date: 06/06/2022    Pt will demo IND with HEP                    Baseline: Not IND            Goal status: INITIAL   LONG TERM GOALS: Target date: 07/18/2022    1.Pt will demo proper deep core coordination without chest breathing and optimal excursion of diaphragm/pelvic floor in order to promote spinal stability and pelvic floor function  Baseline: dyscoordination Goal status: INITIAL  2.  Pt will demo > 5 pt change on FOTO  to improve QOL and function  PFDI Urinary baseline - 25 Lower score = better function  Urinary Problem baseline-  40 Higher score = better function   Goal status: INITIAL  3.  Pt will demo proper body mechanics in against gravity tasks and ADLs  work tasks, fitness  to minimize straining pelvic floor / back                  Baseline: not IND, improper form that places strain on pelvic floor                Goal status: INITIAL    4. Pt will demo increased gait speed > 1.3 m/s in order to ambulate safely in community and return to fitness routine  Baseline: 0.9 m/s ( limpingdecrease R stance phase),   ( with shoe lift in R shoe , 1.22 m/s)  Goal status: INITIAL    5. Pt will demo levelled pelvic girdle and shoulder height in order to progress to deep core strengthening HEP and restore mobility at spine, pelvis, gait, posture   Baseline:  L  shoulder and iliac crest was higher  Goal status: INITIAL   6. Pt will decrease to 1 diaper / urinary pads  Baseline:  2- 3 Diaper and urinary pads per day  Goal status: INITIAL                           7.  Pt will demo decreased separation of diastasis to < 2 fingers width in order to promote stronger intraabdominal pressure system for spinal / postural stability and pelvic floor function                 Baseline: 3 FINGERS WIDTH above umbilicus    Goal status: INITIAL   Mariane Masters, PT 06/05/2022, 9:43 AM

## 2022-06-13 ENCOUNTER — Ambulatory Visit: Payer: Medicare HMO | Attending: Urology | Admitting: Physical Therapy

## 2022-06-13 ENCOUNTER — Ambulatory Visit: Payer: Medicare HMO | Admitting: Physical Therapy

## 2022-06-13 DIAGNOSIS — R278 Other lack of coordination: Secondary | ICD-10-CM | POA: Diagnosis present

## 2022-06-13 DIAGNOSIS — R2689 Other abnormalities of gait and mobility: Secondary | ICD-10-CM | POA: Insufficient documentation

## 2022-06-13 DIAGNOSIS — M2170 Unequal limb length (acquired), unspecified site: Secondary | ICD-10-CM | POA: Diagnosis present

## 2022-06-13 NOTE — Therapy (Signed)
OUTPATIENT PHYSICAL THERAPY Treatment    Patient Name: Andre Edwards MRN: [id number] DOB:1945/07/03, 77 y.o., male Today's Date: 06/13/2022   PT End of Session - 06/13/22 1042     Visit Number 6    Number of Visits 10    PT Start Time 1017    PT Stop Time 1055    PT Time Calculation (min) 38 min    Activity Tolerance Patient tolerated treatment well    Behavior During Therapy WFL for tasks assessed/performed             Past Medical History:  Diagnosis Date   Cataract    Diabetes mellitus without complication (HCC)    Enlarged prostate    Hyperlipidemia    Hypertension    Pneumonia 02/11/2013   Sleep apnea    has not worn a CPAP   Past Surgical History:  Procedure Laterality Date   CHOLECYSTECTOMY     CHOLECYSTECTOMY N/A 07/04/2014   Procedure: LAPAROSCOPIC CHOLECYSTECTOMY;  Surgeon: Kieth Brightly, MD;  Location: [facility];  Service: General;  Laterality: N/A;   EYE SURGERY Bilateral 2015   HOLEP-LASER ENUCLEATION OF THE PROSTATE WITH MORCELLATION N/A 02/25/2022   Procedure: HOLEP-LASER ENUCLEATION OF THE PROSTATE WITH MORCELLATION;  Surgeon: Vanna Scotland, MD;  Location: [facility] OR[facility];  Service: Urology;  Laterality: N/A;   PROSTATE SURGERY N/A    Patient Active Problem List   Diagnosis Date Noted   Hypogonadism in male 09/24/2015   Controlled type 2 diabetes mellitus without complication (HCC) 08/22/2015   Elevated prostate specific antigen (PSA) 08/22/2015   Chronic fatigue 08/22/2015   Benign fibroma of prostate 06/29/2014   Burn 06/29/2014   Diabetes mellitus, type 2 (HCC) 06/29/2014   Abnormal prostate specific antigen 06/29/2014   Allergy to environmental factors 06/29/2014   HLD (hyperlipidemia) 06/29/2014   BP (high blood pressure) 06/29/2014   Stasis, venous 06/29/2014    PCP:  Judithann Sheen  REFERRING PROVIDER: Lurlean Horns DIAG:  Rationale for Evaluation and Treatment Rehabilitation  THERAPY DIAG:  No diagnosis found.  ONSET DATE:  Holep 02/25/22   SUBJECTIVE:                                                                                                                                                                                           SUBJECTIVE STATEMENT today  Pt reported leakage is better but some days it it not better  SUBJECTIVE STATEMENT on EVAL 05/09/22 : 1) not able to feel urge to pee  2) urinary leakage:  changes 2- 3 Diaper and urinary pads per day   3) B knee pain: L worst than  R. Walking down ramp hurts 5-6/10 pain   PERTINENT HISTORY:    PAIN:  Are you having pain? Yes: see above   PRECAUTIONS: None  WEIGHT BEARING RESTRICTIONS: No  FALLS:  Has patient fallen in last 6 months? No  LIVING ENVIRONMENT: Lives with: lives with their spouse Lives in: 2 story  Stairs: Yes    OCCUPATION: Copywriter, advertising for 46 years   PLOF: Independent  PATIENT GOALS:   Get the urge to urinate and decrease pads   OBJECTIVE:   [facility] PT Assessment - 06/13/22 1329       Observation/Other Assessments   Observations more upright posture      Posture/Postural Control   Posture Comments scapular stabilization in BUE hip ext             [facility] Adult PT Treatment/Exercise - 06/13/22 1329       Therapeutic Activites    Other Therapeutic Activities instructed techniques on how to don and doff the resistance band off ankle with stability to minimize risk for falls      Neuro Re-ed    Neuro Re-ed Details  cued for coordination with exhalation on use of resistance bands in BLE exercises      Exercises   Exercises Other Exercises    Other Exercises  see pt instructions               HOME EXERCISE PROGRAM: See pt instruction section    ASSESSMENT:  CLINICAL IMPRESSION:    Provided resistance strengthening for BLE for quad, hamstrings, and gluts.  Cued for cervicoscapular strengthening.  Pt demo'd IND with technique and minimize risk for falls when doff/donning band onto ankle.   Anticipate this routine will help with overall conditioning.   Plan to review and progress kegel program next session.   Plan to also  upcoming sessions.  Pt benefits from skilled PT.    OBJECTIVE IMPAIRMENTS decreased activity tolerance, decreased coordination, decreased endurance, decreased mobility, difficulty walking, decreased ROM, decreased strength, decreased safety awareness, hypomobility, increased muscle spasms, impaired flexibility, improper body mechanics, postural dysfunction, and pain. scar restrictions   ACTIVITY LIMITATIONS  self-care,  sleep, home chores, work tasks    PARTICIPATION LIMITATIONS:  community,    PERSONAL FACTORS     leg length difference is affecting patient's functional outcome but it is getting addressed with shoe lift  which will help both incontinence and knee pain  REHAB POTENTIAL: Good   CLINICAL DECISION MAKING: Evolving/moderate complexity   EVALUATION COMPLEXITY: Moderate    PATIENT EDUCATION:    Education details: Showed pt anatomy images. Explained muscles attachments/ connection, physiology of deep core system/ spinal- thoracic-pelvis-lower kinetic chain as they relate to pt's presentation, Sx, and past Hx. Explained what and how these areas of deficits need to be restored to balance and function    See Therapeutic activity / neuromuscular re-education section  Answered pt's questions.   Person educated: Patient Education method: Explanation, Demonstration, Tactile cues, Verbal cues, and Handouts Education comprehension: verbalized understanding, returned demonstration, verbal cues required, tactile cues required, and needs further education     PLAN: PT FREQUENCY: 1x/week   PT DURATION: 10 weeks   PLANNED INTERVENTIONS: Therapeutic exercises, Therapeutic activity, Neuromuscular re-education, Balance training, Gait training, Patient/Family education, Self Care, Joint mobilization, Spinal mobilization, Moist heat, Taping, and Manual  therapy, dry needling.   PLAN FOR NEXT SESSION: See clinical impression for plan     GOALS: Goals reviewed with patient? Yes  SHORT TERM GOALS:  Target date: 06/06/2022    Pt will demo IND with HEP                    Baseline: Not IND            Goal status: INITIAL   LONG TERM GOALS: Target date: 07/18/2022    1.Pt will demo proper deep core coordination without chest breathing and optimal excursion of diaphragm/pelvic floor in order to promote spinal stability and pelvic floor function  Baseline: dyscoordination Goal status: INITIAL  2.  Pt will demo > 5 pt change on FOTO  to improve QOL and function  PFDI Urinary baseline - 25 Lower score = better function  Urinary Problem baseline-  40 Higher score = better function   Goal status: INITIAL  3.  Pt will demo proper body mechanics in against gravity tasks and ADLs  work tasks, fitness  to minimize straining pelvic floor / back                  Baseline: not IND, improper form that places strain on pelvic floor                Goal status: INITIAL    4. Pt will demo increased gait speed > 1.3 m/s in order to ambulate safely in community and return to fitness routine  Baseline: 0.9 m/s ( limpingdecrease R stance phase),   ( with shoe lift in R shoe , 1.22 m/s)  Goal status: INITIAL    5. Pt will demo levelled pelvic girdle and shoulder height in order to progress to deep core strengthening HEP and restore mobility at spine, pelvis, gait, posture   Baseline:  L shoulder and iliac crest was higher  Goal status: INITIAL   6. Pt will decrease to 1 diaper / urinary pads  Baseline:  2- 3 Diaper and urinary pads per day  Goal status: INITIAL                           7.  Pt will demo decreased separation of diastasis to < 2 fingers width in order to promote stronger intraabdominal pressure system for spinal / postural stability and pelvic floor function                 Baseline: 3 FINGERS WIDTH above umbilicus    Goal  status: INITIAL   Mariane Masters, PT 06/13/2022, 3:45 PM

## 2022-06-13 NOTE — Patient Instructions (Addendum)
Green band under chair legs   1) quad strengthening,   Sit tall, pushing hands down on edges of seat , shoulders down   Straightening leg on exhale  20 reps  x 2 x day     2) hamstring strengthening   Sit in other chair that faces the chair with the loops under legs Kick back ankle  20 reps  x  2 x day    3)  glut strengthening  Standing, leaning over arms of chair   Stepping back , push off toes  Alternate legs  20 reps   __   Seated away from the back of the chair   Blue band at hands, thumbs out  Elbow stays by ribs Inhale  Exhale pull apart with elbows staying by ribs  20 reps

## 2022-06-20 ENCOUNTER — Ambulatory Visit: Payer: Medicare HMO | Admitting: Physical Therapy

## 2022-06-20 DIAGNOSIS — R278 Other lack of coordination: Secondary | ICD-10-CM

## 2022-06-20 DIAGNOSIS — R2689 Other abnormalities of gait and mobility: Secondary | ICD-10-CM | POA: Diagnosis not present

## 2022-06-20 DIAGNOSIS — M2170 Unequal limb length (acquired), unspecified site: Secondary | ICD-10-CM

## 2022-06-20 NOTE — Therapy (Signed)
OUTPATIENT PHYSICAL THERAPY Treatment    Patient Name: PORTLAND MINTEN MRN: [id number] DOB:[birth year], 77 y.o., male Today's Date: 06/20/2022   PT End of Session - 06/20/22 1113     Visit Number 7    Number of Visits 10    PT Start Time 1103    PT Stop Time 1145    PT Time Calculation (min) 42 min    Activity Tolerance Patient tolerated treatment well    Behavior During Therapy WFL for tasks assessed/performed             Past Medical History:  Diagnosis Date   Cataract    Diabetes mellitus without complication (HCC)    Enlarged prostate    Hyperlipidemia    Hypertension    Pneumonia 02/11/2013   Sleep apnea    has not worn a CPAP   Past Surgical History:  Procedure Laterality Date   CHOLECYSTECTOMY     CHOLECYSTECTOMY N/A 07/04/2014   Procedure: LAPAROSCOPIC CHOLECYSTECTOMY;  Surgeon: Kieth Brightly, MD;  Location: [facility];  Service: General;  Laterality: N/A;   EYE SURGERY Bilateral 2015   HOLEP-LASER ENUCLEATION OF THE PROSTATE WITH MORCELLATION N/A 02/25/2022   Procedure: HOLEP-LASER ENUCLEATION OF THE PROSTATE WITH MORCELLATION;  Surgeon: Vanna Scotland, MD;  Location: [facility];  Service: Urology;  Laterality: N/A;   PROSTATE SURGERY N/A    Patient Active Problem List   Diagnosis Date Noted   Hypogonadism in male 09/24/2015   Controlled type 2 diabetes mellitus without complication (HCC) 08/22/2015   Elevated prostate specific antigen (PSA) 08/22/2015   Chronic fatigue 08/22/2015   Benign fibroma of prostate 06/29/2014   Burn 06/29/2014   Diabetes mellitus, type 2 (HCC) 06/29/2014   Abnormal prostate specific antigen 06/29/2014   Allergy to environmental factors 06/29/2014   HLD (hyperlipidemia) 06/29/2014   BP (high blood pressure) 06/29/2014   Stasis, venous 06/29/2014    PCP:  Judithann Sheen  REFERRING PROVIDER: Lurlean Horns DIAG:  Rationale for Evaluation and Treatment Rehabilitation  THERAPY DIAG:  Other abnormalities of gait and  mobility  Other lack of coordination  Leg length inequality  ONSET DATE: Holep 02/25/22   SUBJECTIVE:                                                                                                                                                                                           SUBJECTIVE STATEMENT today  Pt reported leakage is better. Pt is wearing only one diaper and pad inside instead of wearing 3 diapers per day.   Pt had no issues with the past band exercises   SUBJECTIVE STATEMENT on EVAL  05/09/22 : 1) not able to feel urge to pee  2) urinary leakage:  changes 2- 3 Diaper and urinary pads per day   3) B knee pain: L worst than R. Walking down ramp hurts 5-6/10 pain   PERTINENT HISTORY:    PAIN:  Are you having pain? Yes: see above   PRECAUTIONS: None  WEIGHT BEARING RESTRICTIONS: No  FALLS:  Has patient fallen in last 6 months? No  LIVING ENVIRONMENT: Lives with: lives with their spouse Lives in: 2 story  Stairs: Yes    OCCUPATION: Copywriter, advertising for 46 years   PLOF: Independent  PATIENT GOALS:   Get the urge to urinate and decrease pads   OBJECTIVE:    Pelvic Floor Special Questions - 06/20/22 1149     Diastasis Recti 2 fingers    External Perineal Exam through clothing: 5 quick contraction, initially dyscoordination with ab overuse , hooklying               [facility] Adult PT Treatment/Exercise - 06/20/22 1151       Therapeutic Activites    Other Therapeutic Activities consolidated pelvic floor and deep core routine for compliance      Neuro Re-ed    Neuro Re-ed Details  excessive cues for less ab overuse, optimal diaphragm /pelvic floor, cued for 5 quick contractions              HOME EXERCISE PROGRAM: See pt instruction section    ASSESSMENT:  CLINICAL IMPRESSION:    Pt reported leakage is better. Pt is wearing only one diaper and pad inside instead of wearing 3 diapers per day.   Pt required cues for less ab  overuse with quick pelvic floor contractions. Pt achieved 5 quick contractions with proper technique  Consolidated kegel program and deep core level 1-2 for compliance.   Anticipate kegel program will be helpful for long term improvement.              Pt benefits from skilled PT.    OBJECTIVE IMPAIRMENTS decreased activity tolerance, decreased coordination, decreased endurance, decreased mobility, difficulty walking, decreased ROM, decreased strength, decreased safety awareness, hypomobility, increased muscle spasms, impaired flexibility, improper body mechanics, postural dysfunction, and pain. scar restrictions   ACTIVITY LIMITATIONS  self-care,  sleep, home chores, work tasks    PARTICIPATION LIMITATIONS:  community,    PERSONAL FACTORS     leg length difference is affecting patient's functional outcome but it is getting addressed with shoe lift  which will help both incontinence and knee pain  REHAB POTENTIAL: Good   CLINICAL DECISION MAKING: Evolving/moderate complexity   EVALUATION COMPLEXITY: Moderate    PATIENT EDUCATION:    Education details: Showed pt anatomy images. Explained muscles attachments/ connection, physiology of deep core system/ spinal- thoracic-pelvis-lower kinetic chain as they relate to pt's presentation, Sx, and past Hx. Explained what and how these areas of deficits need to be restored to balance and function    See Therapeutic activity / neuromuscular re-education section  Answered pt's questions.   Person educated: Patient Education method: Explanation, Demonstration, Tactile cues, Verbal cues, and Handouts Education comprehension: verbalized understanding, returned demonstration, verbal cues required, tactile cues required, and needs further education     PLAN: PT FREQUENCY: 1x/week   PT DURATION: 10 weeks   PLANNED INTERVENTIONS: Therapeutic exercises, Therapeutic activity, Neuromuscular re-education, Balance training, Gait training,  Patient/Family education, Self Care, Joint mobilization, Spinal mobilization, Moist heat, Taping, and Manual therapy, dry needling.   PLAN FOR  NEXT SESSION: See clinical impression for plan     GOALS: Goals reviewed with patient? Yes  SHORT TERM GOALS: Target date: 06/06/2022    Pt will demo IND with HEP                    Baseline: Not IND            Goal status: INITIAL   LONG TERM GOALS: Target date: 07/18/2022    1.Pt will demo proper deep core coordination without chest breathing and optimal excursion of diaphragm/pelvic floor in order to promote spinal stability and pelvic floor function  Baseline: dyscoordination Goal status: MET   2.  Pt will demo > 5 pt change on FOTO  to improve QOL and function  PFDI Urinary baseline - 25 Lower score = better function  Urinary Problem baseline-  40 Higher score = better function   Goal status: INITIAL  3.  Pt will demo proper body mechanics in against gravity tasks and ADLs  work tasks, fitness  to minimize straining pelvic floor / back                  Baseline: not IND, improper form that places strain on pelvic floor                Goal status: MET     4. Pt will demo increased gait speed > 1.3 m/s in order to ambulate safely in community and return to fitness routine  Baseline: 0.9 m/s ( limpingdecrease R stance phase),   ( with shoe lift in R shoe , 1.22 m/s)  Goal status: I    5. Pt will demo levelled pelvic girdle and shoulder height in order to progress to deep core strengthening HEP and restore mobility at spine, pelvis, gait, posture   Baseline:  L shoulder and iliac crest was higher  Goal status: MET    6. Pt will decrease to 1 diaper / urinary pads  Baseline:  2- 3 Diaper and urinary pads per day  Goal status: INITIAL                           7.  Pt will demo decreased separation of diastasis to < 2 fingers width in order to promote stronger intraabdominal pressure system for spinal / postural stability  and pelvic floor function                 Baseline: 3 FINGERS WIDTH above umbilicus    Goal status: MET   Mariane Masters, PT 06/20/2022, 11:13 AM

## 2022-06-20 NOTE — Patient Instructions (Addendum)
Morning and dinner   Mon Tue Wed Thurs Fri  Sat Sun  brushing stretch L / R - 15 reps            Pelvic floor squeeze  5 reps            Deep core level 1     Deep core level 2  ( knee out)  L and R =1 rep, count with fingers  15 reps           Pelvic floor squeeze  5 reps

## 2022-06-26 ENCOUNTER — Telehealth: Payer: Self-pay | Admitting: Urology

## 2022-06-26 NOTE — Telephone Encounter (Signed)
Spoke with patient. Patient started having the same rash around scrotal area again, red, itchy, raised. This started about 1 to 2 weeks ago. Trimacinolone cream is not helping this time. No openings until 06/28/22. Patient scheduled for 06/28/22. Patient advised if there are any other recommendations I would give him a call back. Itching is bad per patient Patient saw Carollee Herter for this issue last, sending this to her to review.

## 2022-06-26 NOTE — Telephone Encounter (Signed)
Pt's wife LMOM that he is breaking out on his private parts again.  They weren't sure if he needed an appt or not.  He also had surgery 1/15.

## 2022-06-26 NOTE — Telephone Encounter (Signed)
Patient advised.

## 2022-06-27 ENCOUNTER — Ambulatory Visit: Payer: Medicare HMO | Admitting: Physical Therapy

## 2022-06-27 DIAGNOSIS — R2689 Other abnormalities of gait and mobility: Secondary | ICD-10-CM | POA: Diagnosis not present

## 2022-06-27 DIAGNOSIS — R278 Other lack of coordination: Secondary | ICD-10-CM

## 2022-06-27 NOTE — Progress Notes (Signed)
06/28/2022 9:01 AM   Andre Edwards 27-Jul-1945 [id number]  Referring provider: Marguarite Arbour, MD [address] [facility] [place],  [place] [address]  Urological history: 1. Prostate cancer -PSA (12/2021) 6.44 -found on HoLEP (02/2022) - INCIDENTAL PROSTATIC ACINAR ADENOCARCINOMA, GLEASON 3+3 (GRADE GROUP 1), INVOLVING 1% OF THE TISSUE EXAMINED. -has appointment in August  2. BPH with LU TS -s/p HoLEP (02/2022)  -Gemtesa 75 mg daily  Chief Complaint  Patient presents with   Follow-up    HPI: Andre Edwards is a 77 y.o. male who presents today for scrotal rash.  At his visit on 04/24/2022, he was issues with scrotal pruritus rash which he attributes to being in wet depends.  He has tried several OTC lotions with no improvement.   His wife had triamcinolone cream on hand, so he used it and saw great improvement.  He does feel that the incontinence is improving and he has his first PT appointment in May.  Patient denies any modifying or aggravating factors.  Patient denies any gross hematuria, dysuria or suprapubic/flank pain.  Patient denies any fevers, chills, nausea or vomiting.    He states the rash did not improve over the last two weeks with the triamcinolone cream.  He feels like it has actually gotten worse.    He continues to have issues with incontinence.    Patient denies any modifying or aggravating factors.  Patient denies any gross hematuria, dysuria or suprapubic/flank pain.  Patient denies any fevers, chills, nausea or vomiting.     PMH: Past Medical History:  Diagnosis Date   Cataract    Diabetes mellitus without complication (HCC)    Enlarged prostate    Hyperlipidemia    Hypertension    Pneumonia 02/11/2013   Sleep apnea    has not worn a CPAP    Surgical History: Past Surgical History:  Procedure Laterality Date   CHOLECYSTECTOMY     CHOLECYSTECTOMY N/A 07/04/2014   Procedure: LAPAROSCOPIC CHOLECYSTECTOMY;  Surgeon:  Kieth Brightly, MD;  Location: [facility];  Service: General;  Laterality: N/A;   EYE SURGERY Bilateral 2015   HOLEP-LASER ENUCLEATION OF THE PROSTATE WITH MORCELLATION N/A 02/25/2022   Procedure: HOLEP-LASER ENUCLEATION OF THE PROSTATE WITH MORCELLATION;  Surgeon: Vanna Scotland, MD;  Location: [facility];  Service: Urology;  Laterality: N/A;   PROSTATE SURGERY N/A     Home Medications:  Allergies as of 06/28/2022       Reactions   Amoxicillin-pot Clavulanate Other (See Comments)   Erythromycin Itching, Swelling   Levofloxacin Itching   tongue   Sulfa Antibiotics    Other reaction(s): Unknown   Tetracycline    Other reaction(s): Unknown        Medication List        Accurate as of Jun 28, 2022  9:01 AM. If you have any questions, ask your nurse or doctor.          acetaminophen 325 MG tablet Commonly known as: TYLENOL Take by mouth every 6 (six) hours as needed for mild pain.   aspirin 81 MG chewable tablet Chew 81 mg by mouth daily.   atorvastatin 10 MG tablet Commonly known as: LIPITOR Take 10 mg by mouth daily.   calcium carbonate 750 MG chewable tablet Commonly known as: TUMS EX Chew 2 tablets by mouth as needed for heartburn.   cetirizine 10 MG tablet Commonly known as: ZYRTEC Take 10 mg by mouth daily.   ciprofloxacin 500 MG  tablet Commonly known as: CIPRO Take 1 tablet (500 mg total) by mouth every 12 (twelve) hours.   cyanocobalamin 1000 MCG tablet Commonly known as: VITAMIN B12 Take 1,000 mcg by mouth daily.   diltiazem 240 MG 24 hr capsule Commonly known as: DILACOR XR TAKE 1 CAPSULE (240 MG TOTAL) BY MOUTH ONCE DAILY.   docusate sodium 100 MG capsule Commonly known as: COLACE Take 100 mg by mouth daily.   Farxiga 10 MG Tabs tablet Generic drug: dapagliflozin propanediol TAKE 1 TABLET BY MOUTH EVERY DAY IN THE MORNING   finasteride 5 MG tablet Commonly known as: PROSCAR Take 1 tablet (5 mg total) by mouth daily.   Gemtesa 75 MG  Tabs Generic drug: Vibegron Take 75 mg by mouth daily.   glipiZIDE 10 MG tablet Commonly known as: GLUCOTROL Take 10 mg by mouth 2 (two) times daily before a meal.   hydrochlorothiazide 25 MG tablet Commonly known as: HYDRODIURIL Take 25 mg by mouth daily.   HYDROcodone-acetaminophen 5-325 MG tablet Commonly known as: NORCO/VICODIN Take 1-2 tablets by mouth every 6 (six) hours as needed for moderate pain.   magnesium oxide 400 MG tablet Commonly known as: MAG-OX TAKE 1 TABLET (400 MG TOTAL) BY MOUTH ONCE DAILY.   metFORMIN 1000 MG tablet Commonly known as: GLUCOPHAGE Take 1,000 mg by mouth 2 (two) times daily with a meal.   nystatin-triamcinolone ointment Commonly known as: MYCOLOG Apply 1 Application topically 2 (two) times daily.   phenylephrine 10 MG Tabs tablet Commonly known as: SUDAFED PE Take 10 mg by mouth every 4 (four) hours as needed.   tamsulosin 0.4 MG Caps capsule Commonly known as: FLOMAX TAKE 1 CAPSULE BY MOUTH TWICE A DAY 30 MINUTES AFTER BREAKFAST AND DINNER   traMADol 50 MG tablet Commonly known as: ULTRAM TAKE 1 TABLET (50 MG TOTAL) BY MOUTH EVERY 6 (SIX) HOURS AS NEEDED FOR PAIN FOR UP TO 30 DOSES   triamcinolone 0.025 % ointment Commonly known as: KENALOG Apply 1 Application topically 2 (two) times daily.        Allergies:  Allergies  Allergen Reactions   Amoxicillin-Pot Clavulanate Other (See Comments)   Erythromycin Itching and Swelling   Levofloxacin Itching    tongue   Sulfa Antibiotics     Other reaction(s): Unknown   Tetracycline     Other reaction(s): Unknown    Family History: No family history on file.  Social History:  reports that he has never smoked. His smokeless tobacco use includes chew. He reports that he does not drink alcohol and does not use drugs.  ROS: Pertinent ROS in HPI  Physical Exam: BP 121/74   Pulse 93   Ht 5\' 10"  (1.778 m)   Wt 205 lb (93 kg)   BMI 29.41 kg/m   Constitutional:  Well  nourished. Alert and oriented, No acute distress. HEENT: [place] AT, moist mucus membranes.  Trachea midline Cardiovascular: No clubbing, cyanosis, or edema. Respiratory: Normal respiratory effort, no increased work of breathing. GU: No CVA tenderness.  No bladder fullness or masses.  Patient with uncircumcised phallus. Foreskin easily retracted  Urethral meatus is patent.  No penile discharge. No penile lesions or rashes. Scrotum without lesions, cysts, rashes and/or edema.  Testicles are located scrotally bilaterally. No masses are appreciated in the testicles. Left and right epididymis are normal.  Erythematous, scaling plaques on medial thighs, inguinal folds and pubic area with areas of clearing with the large plaques.    Neurologic: Grossly intact, no focal deficits,  moving all 4 extremities. Psychiatric: Normal mood and affect.   Laboratory Data: N/A   Pertinent Imaging: N/A  Assessment & Plan:    1. Scrotal rash  -advised him to acquire a incontinence clamp to help reduce skin irritation from continual exposure to damp depends -I prescribed Mycolog cream to be applied twice daily to the rash -Also advised to continue the Desitin after he is allowed 1 hour after applying the Mycolog cream to create a barrier for his skin   2. Incontinence -Hopefully he will have continued improvement -advised him to use a incontinence clamp  Return in about 2 weeks (around 07/12/2022) for recheck on rash .  These notes generated with voice recognition software. I apologize for typographical errors.  Cloretta Ned  [facility] [address]  [address] [place], [place] [address] [phone]

## 2022-06-27 NOTE — Therapy (Signed)
OUTPATIENT PHYSICAL THERAPY Treatment / Discharge Summary across 8 visits    Patient Name: Andre Edwards MRN: [id number] DOB:August 02, 1945, 77 y.o., male Today's Date: 06/27/2022   PT End of Session - 06/27/22 1031     Visit Number 8    Number of Visits 10    PT Start Time 1020    PT Stop Time 1100    PT Time Calculation (min) 40 min    Activity Tolerance Patient tolerated treatment well    Behavior During Therapy WFL for tasks assessed/performed             Past Medical History:  Diagnosis Date   Cataract    Diabetes mellitus without complication (HCC)    Enlarged prostate    Hyperlipidemia    Hypertension    Pneumonia 02/11/2013   Sleep apnea    has not worn a CPAP   Past Surgical History:  Procedure Laterality Date   CHOLECYSTECTOMY     CHOLECYSTECTOMY N/A 07/04/2014   Procedure: LAPAROSCOPIC CHOLECYSTECTOMY;  Surgeon: Kieth Brightly, MD;  Location: [facility];  Service: General;  Laterality: N/A;   EYE SURGERY Bilateral 2015   HOLEP-LASER ENUCLEATION OF THE PROSTATE WITH MORCELLATION N/A 02/25/2022   Procedure: HOLEP-LASER ENUCLEATION OF THE PROSTATE WITH MORCELLATION;  Surgeon: Vanna Scotland, MD;  Location: [facility];  Service: Urology;  Laterality: N/A;   PROSTATE SURGERY N/A    Patient Active Problem List   Diagnosis Date Noted   Hypogonadism in male 09/24/2015   Controlled type 2 diabetes mellitus without complication (HCC) 08/22/2015   Elevated prostate specific antigen (PSA) 08/22/2015   Chronic fatigue 08/22/2015   Benign fibroma of prostate 06/29/2014   Burn 06/29/2014   Diabetes mellitus, type 2 (HCC) 06/29/2014   Abnormal prostate specific antigen 06/29/2014   Allergy to environmental factors 06/29/2014   HLD (hyperlipidemia) 06/29/2014   BP (high blood pressure) 06/29/2014   Stasis, venous 06/29/2014    PCP:  Judithann Sheen  REFERRING PROVIDER: Lurlean Horns DIAG:  Rationale for Evaluation and Treatment Rehabilitation  THERAPY DIAG:   Other abnormalities of gait and mobility  Other lack of coordination  ONSET DATE: Holep 02/25/22   SUBJECTIVE:                                                                                                                                                                                           SUBJECTIVE STATEMENT today  Pt reported leakage is better. Pt is wearing only one diaper and pad inside instead of wearing 3 diapers per day.   Pt had no issues with the past band exercises   SUBJECTIVE STATEMENT  on EVAL 05/09/22 : 1) not able to feel urge to pee  2) urinary leakage:  changes 2- 3 Diaper and urinary pads per day   3) B knee pain: L worst than R. Walking down ramp hurts 5-6/10 pain   PERTINENT HISTORY:    PAIN:  Are you having pain? Yes: see above   PRECAUTIONS: None  WEIGHT BEARING RESTRICTIONS: No  FALLS:  Has patient fallen in last 6 months? No  LIVING ENVIRONMENT: Lives with: lives with their spouse Lives in: 2 story  Stairs: Yes    OCCUPATION: Copywriter, advertising for 46 years   PLOF: Independent  PATIENT GOALS:   Get the urge to urinate and decrease pads   OBJECTIVE:        [facility] PT Assessment - 06/27/22 1057       Observation/Other Assessments   Observations shoel ift in new shoe not properly placed      Ambulation/Gait   Gait Comments with shoe lift 1.16 m/s recirpocal gait , equal WBing             [facility] Adult PT Treatment/Exercise - 06/27/22 1057       Therapeutic Activites    Other Therapeutic Activities instructed how to place shoe lifts into new shoe, reassessed gaols, discussed d/c                 HOME EXERCISE PROGRAM: See pt instruction section    ASSESSMENT:  CLINICAL IMPRESSION:    Pt met 100% of his goals and ready for d/c. Pt has been compliant with HEP for long and quick contraction for pelvic floor and for deep core exercises. Pt also been compliant for BLE strengthening for quads, hamstrings, gluts  for overall conditioning.   Pt reported leakage is better. Pt is wearing only one diaper and pad inside instead of wearing 3 diapers per day.  FOTO score for urinary issues  have improved significantly ( see goals).   Pt has increased urge to urinate compared to before.     Pt reports B knee pain improved by 50%.      Gait speed, pelvic and spinal alignment , diastasis recti and deep core coordination and strength have improved.  Leg length difference with shorter R leg has been addressed with shoe lift.     Consolidated kegel program and deep core level 1-2 for compliance.   Anticipate kegel program will be helpful for long term improvement.              OBJECTIVE IMPAIRMENTS decreased activity tolerance, decreased coordination, decreased endurance, decreased mobility, difficulty walking, decreased ROM, decreased strength, decreased safety awareness, hypomobility, increased muscle spasms, impaired flexibility, improper body mechanics, postural dysfunction, and pain. scar restrictions   ACTIVITY LIMITATIONS  self-care,  sleep, home chores, work tasks    PARTICIPATION LIMITATIONS:  community,    PERSONAL FACTORS     leg length difference is affecting patient's functional outcome but it is getting addressed with shoe lift  which will help both incontinence and knee pain  REHAB POTENTIAL: Good   CLINICAL DECISION MAKING: Evolving/moderate complexity   EVALUATION COMPLEXITY: Moderate    PATIENT EDUCATION:    Education details: Showed pt anatomy images. Explained muscles attachments/ connection, physiology of deep core system/ spinal- thoracic-pelvis-lower kinetic chain as they relate to pt's presentation, Sx, and past Hx. Explained what and how these areas of deficits need to be restored to balance and function    See Therapeutic activity / neuromuscular re-education section  Answered pt's questions.   Person educated: Patient Education method: Explanation, Demonstration, Tactile  cues, Verbal cues, and Handouts Education comprehension: verbalized understanding, returned demonstration, verbal cues required, tactile cues required, and needs further education     PLAN: PT FREQUENCY: 1x/week   PT DURATION: 10 weeks   PLANNED INTERVENTIONS: Therapeutic exercises, Therapeutic activity, Neuromuscular re-education, Balance training, Gait training, Patient/Family education, Self Care, Joint mobilization, Spinal mobilization, Moist heat, Taping, and Manual therapy, dry needling.   PLAN FOR NEXT SESSION: See clinical impression for plan     GOALS: Goals reviewed with patient? Yes  SHORT TERM GOALS: Target date: 06/06/2022    Pt will demo IND with HEP                    Baseline: Not IND            Goal status: MET    LONG TERM GOALS: Target date: 07/18/2022    1.Pt will demo proper deep core coordination without chest breathing and optimal excursion of diaphragm/pelvic floor in order to promote spinal stability and pelvic floor function  Baseline: dyscoordination Goal status: MET   2.  Pt will demo > 5 pt change on FOTO  to improve QOL and function  PFDI Urinary baseline - 25  -->  4 pts ( 06/27/22)  Lower score = better function  Urinary Problem baseline-  40  -> 57 pts ( 06/27/22)  Higher score = better function   Goal status: MET   3.  Pt will demo proper body mechanics in against gravity tasks and ADLs  work tasks, fitness  to minimize straining pelvic floor / back                  Baseline: not IND, improper form that places strain on pelvic floor                Goal status: MET     4. Pt will demo increased gait speed > 1.3 m/s in order to ambulate safely in community and return to fitness routine  Baseline: 0.9 m/s ( limpingdecrease R stance phase),   ( with shoe lift in R shoe , 1.22 m/s)  Goal status: MET     5. Pt will demo levelled pelvic girdle and shoulder height in order to progress to deep core strengthening HEP and restore mobility at  spine, pelvis, gait, posture   Baseline:  L shoulder and iliac crest was higher  Goal status: MET    6. Pt will decrease to 1 diaper / urinary pads  Baseline:  2- 3 Diaper and urinary pads per day  Goal status: MET ( 06/27/22:  1 diaper night and day)                            7.  Pt will demo decreased separation of diastasis to < 2 fingers width in order to promote stronger intraabdominal pressure system for spinal / postural stability and pelvic floor function                 Baseline: 3 FINGERS WIDTH above umbilicus    Goal status: MET   Mariane Masters, PT 06/27/2022, 10:31 AM

## 2022-06-28 ENCOUNTER — Encounter: Payer: Self-pay | Admitting: Urology

## 2022-06-28 ENCOUNTER — Ambulatory Visit: Payer: Medicare HMO | Admitting: Urology

## 2022-06-28 VITALS — BP 121/74 | HR 93 | Ht 70.0 in | Wt 205.0 lb

## 2022-06-28 DIAGNOSIS — N393 Stress incontinence (female) (male): Secondary | ICD-10-CM | POA: Diagnosis not present

## 2022-06-28 DIAGNOSIS — R21 Rash and other nonspecific skin eruption: Secondary | ICD-10-CM

## 2022-06-28 MED ORDER — NYSTATIN-TRIAMCINOLONE 100000-0.1 UNIT/GM-% EX OINT: 1.0000 | TOPICAL_OINTMENT | Freq: Two times a day (BID) | CUTANEOUS | 0 refills | Status: DC

## 2022-07-04 ENCOUNTER — Ambulatory Visit: Payer: Medicare HMO | Admitting: Physical Therapy

## 2022-07-11 ENCOUNTER — Ambulatory Visit: Payer: Medicare HMO | Admitting: Physical Therapy

## 2022-07-11 NOTE — Progress Notes (Deleted)
07/12/2022 2:34 PM   Andre Edwards 1945/03/04 [id number]  Referring provider: Marguarite Arbour, MD [address] [facility] [place],  [place] [address]  Urological history: 1. Prostate cancer -PSA (12/2021) 6.44 -found on HoLEP (02/2022) - INCIDENTAL PROSTATIC ACINAR ADENOCARCINOMA, GLEASON 3+3 (GRADE GROUP 1), INVOLVING 1% OF THE TISSUE EXAMINED. -has appointment in August  2. BPH with LU TS -s/p HoLEP (02/2022)  -Gemtesa 75 mg daily  No chief complaint on file.   HPI: Andre Edwards is a 77 y.o. male who presents today for scrotal rash.  At his visit on 04/24/2022, he was issues with scrotal pruritus rash which he attributes to being in wet depends.  He has tried several OTC lotions with no improvement.   His wife had triamcinolone cream on hand, so he used it and saw great improvement.  He does feel that the incontinence is improving and he has his first PT appointment in May.  Patient denies any modifying or aggravating factors.  Patient denies any gross hematuria, dysuria or suprapubic/flank pain.  Patient denies any fevers, chills, nausea or vomiting.    He states the rash did not improve over the last two weeks with the triamcinolone cream.  He feels like it has actually gotten worse.    He continues to have issues with incontinence.    Patient denies any modifying or aggravating factors.  Patient denies any gross hematuria, dysuria or suprapubic/flank pain.  Patient denies any fevers, chills, nausea or vomiting.     PMH: Past Medical History:  Diagnosis Date   Cataract    Diabetes mellitus without complication (HCC)    Enlarged prostate    Hyperlipidemia    Hypertension    Pneumonia 02/11/2013   Sleep apnea    has not worn a CPAP    Surgical History: Past Surgical History:  Procedure Laterality Date   CHOLECYSTECTOMY     CHOLECYSTECTOMY N/A 07/04/2014   Procedure: LAPAROSCOPIC CHOLECYSTECTOMY;  Surgeon: Kieth Brightly, MD;   Location: [facility];  Service: General;  Laterality: N/A;   EYE SURGERY Bilateral 2015   HOLEP-LASER ENUCLEATION OF THE PROSTATE WITH MORCELLATION N/A 02/25/2022   Procedure: HOLEP-LASER ENUCLEATION OF THE PROSTATE WITH MORCELLATION;  Surgeon: Vanna Scotland, MD;  Location: [facility];  Service: Urology;  Laterality: N/A;   PROSTATE SURGERY N/A     Home Medications:  Allergies as of 07/12/2022       Reactions   Amoxicillin-pot Clavulanate Other (See Comments)   Erythromycin Itching, Swelling   Levofloxacin Itching   tongue   Sulfa Antibiotics    Other reaction(s): Unknown   Tetracycline    Other reaction(s): Unknown        Medication List        Accurate as of Jul 11, 2022  2:34 PM. If you have any questions, ask your nurse or doctor.          acetaminophen 325 MG tablet Commonly known as: TYLENOL Take by mouth every 6 (six) hours as needed for mild pain.   aspirin 81 MG chewable tablet Chew 81 mg by mouth daily.   atorvastatin 10 MG tablet Commonly known as: LIPITOR Take 10 mg by mouth daily.   calcium carbonate 750 MG chewable tablet Commonly known as: TUMS EX Chew 2 tablets by mouth as needed for heartburn.   cetirizine 10 MG tablet Commonly known as: ZYRTEC Take 10 mg by mouth daily.   ciprofloxacin 500 MG tablet Commonly known as: CIPRO  Take 1 tablet (500 mg total) by mouth every 12 (twelve) hours.   cyanocobalamin 1000 MCG tablet Commonly known as: VITAMIN B12 Take 1,000 mcg by mouth daily.   diltiazem 240 MG 24 hr capsule Commonly known as: DILACOR XR TAKE 1 CAPSULE (240 MG TOTAL) BY MOUTH ONCE DAILY.   docusate sodium 100 MG capsule Commonly known as: COLACE Take 100 mg by mouth daily.   Farxiga 10 MG Tabs tablet Generic drug: dapagliflozin propanediol TAKE 1 TABLET BY MOUTH EVERY DAY IN THE MORNING   finasteride 5 MG tablet Commonly known as: PROSCAR Take 1 tablet (5 mg total) by mouth daily.   Gemtesa 75 MG Tabs Generic drug:  Vibegron Take 75 mg by mouth daily.   glipiZIDE 10 MG tablet Commonly known as: GLUCOTROL Take 10 mg by mouth 2 (two) times daily before a meal.   hydrochlorothiazide 25 MG tablet Commonly known as: HYDRODIURIL Take 25 mg by mouth daily.   HYDROcodone-acetaminophen 5-325 MG tablet Commonly known as: NORCO/VICODIN Take 1-2 tablets by mouth every 6 (six) hours as needed for moderate pain.   magnesium oxide 400 MG tablet Commonly known as: MAG-OX TAKE 1 TABLET (400 MG TOTAL) BY MOUTH ONCE DAILY.   metFORMIN 1000 MG tablet Commonly known as: GLUCOPHAGE Take 1,000 mg by mouth 2 (two) times daily with a meal.   nystatin-triamcinolone ointment Commonly known as: MYCOLOG Apply 1 Application topically 2 (two) times daily.   phenylephrine 10 MG Tabs tablet Commonly known as: SUDAFED PE Take 10 mg by mouth every 4 (four) hours as needed.   tamsulosin 0.4 MG Caps capsule Commonly known as: FLOMAX TAKE 1 CAPSULE BY MOUTH TWICE A DAY 30 MINUTES AFTER BREAKFAST AND DINNER   traMADol 50 MG tablet Commonly known as: ULTRAM TAKE 1 TABLET (50 MG TOTAL) BY MOUTH EVERY 6 (SIX) HOURS AS NEEDED FOR PAIN FOR UP TO 30 DOSES   triamcinolone 0.025 % ointment Commonly known as: KENALOG Apply 1 Application topically 2 (two) times daily.        Allergies:  Allergies  Allergen Reactions   Amoxicillin-Pot Clavulanate Other (See Comments)   Erythromycin Itching and Swelling   Levofloxacin Itching    tongue   Sulfa Antibiotics     Other reaction(s): Unknown   Tetracycline     Other reaction(s): Unknown    Family History: No family history on file.  Social History:  reports that he has never smoked. His smokeless tobacco use includes chew. He reports that he does not drink alcohol and does not use drugs.  ROS: Pertinent ROS in HPI  Physical Exam: There were no vitals taken for this visit.  Constitutional:  Well nourished. Alert and oriented, No acute distress. HEENT: [place] AT, moist  mucus membranes.  Trachea midline Cardiovascular: No clubbing, cyanosis, or edema. Respiratory: Normal respiratory effort, no increased work of breathing. GU: No CVA tenderness.  No bladder fullness or masses.  Patient with uncircumcised phallus. Foreskin easily retracted  Urethral meatus is patent.  No penile discharge. No penile lesions or rashes. Scrotum without lesions, cysts, rashes and/or edema.  Testicles are located scrotally bilaterally. No masses are appreciated in the testicles. Left and right epididymis are normal.  Erythematous, scaling plaques on medial thighs, inguinal folds and pubic area with areas of clearing with the large plaques.    Neurologic: Grossly intact, no focal deficits, moving all 4 extremities. Psychiatric: Normal mood and affect.   Laboratory Data: N/A   Pertinent Imaging: N/A  Assessment & Plan:  1. Scrotal rash  -advised him to acquire a incontinence clamp to help reduce skin irritation from continual exposure to damp depends -I prescribed Mycolog cream to be applied twice daily to the rash -Also advised to continue the Desitin after he is allowed 1 hour after applying the Mycolog cream to create a barrier for his skin   2. Incontinence -Hopefully he will have continued improvement -advised him to use a incontinence clamp  No follow-ups on file.  These notes generated with voice recognition software. I apologize for typographical errors.  Cloretta Ned  [facility] [address]  [address] [place], [place] [address] [phone]

## 2022-07-12 ENCOUNTER — Ambulatory Visit: Payer: Medicare HMO | Admitting: Urology

## 2022-07-12 DIAGNOSIS — R21 Rash and other nonspecific skin eruption: Secondary | ICD-10-CM

## 2022-07-24 NOTE — Progress Notes (Signed)
07/25/2022 11:32 AM   Andre Edwards Dec 13, 1945 [id number]  Referring provider: Marguarite Arbour, MD [address] [facility] [place],  [place] [address]  Urological history: 1. Prostate cancer -PSA (12/2021) 6.44 -found on HoLEP (02/2022) - INCIDENTAL PROSTATIC ACINAR ADENOCARCINOMA, GLEASON 3+3 (GRADE GROUP 1), INVOLVING 1% OF THE TISSUE EXAMINED. -has appointment in August  2. BPH with LU TS -s/p HoLEP (02/2022)  -Gemtesa 75 mg daily  Chief Complaint  Patient presents with   scrotal rash   Urinary Incontinence    HPI: Andre Edwards is a 77 y.o. male who presents today for scrotal rash.  At his visit on 04/24/2022, he was issues with scrotal pruritus rash which he attributes to being in wet depends.  He has tried several OTC lotions with no improvement.   His wife had triamcinolone cream on hand, so he used it and saw great improvement.  He does feel that the incontinence is improving and he has his first PT appointment in May.  Patient denies any modifying or aggravating factors.  Patient denies any gross hematuria, dysuria or suprapubic/flank pain.  Patient denies any fevers, chills, nausea or vomiting.    At his visit on 06/28/2022, he states the rash did not improve over the last two weeks with the triamcinolone cream.  He feels like it has actually gotten worse.   He continues to have issues with incontinence.   Patient denies any modifying or aggravating factors.  Patient denies any gross hematuria, dysuria or suprapubic/flank pain.  Patient denies any fevers, chills, nausea or vomiting.  He is prescribed mycolog cream and instructed to come back in 2 weeks.  The rash abated for plan the Mycolog cream.  He also is using timed voids during the day and that is helping him with his incontinence.  Patient denies any modifying or aggravating factors.  Patient denies any gross hematuria, dysuria or suprapubic/flank pain.  Patient denies any fevers, chills,  nausea or vomiting.    PMH: Past Medical History:  Diagnosis Date   Cataract    Diabetes mellitus without complication (HCC)    Enlarged prostate    Hyperlipidemia    Hypertension    Pneumonia 02/11/2013   Sleep apnea    has not worn a CPAP    Surgical History: Past Surgical History:  Procedure Laterality Date   CHOLECYSTECTOMY     CHOLECYSTECTOMY N/A 07/04/2014   Procedure: LAPAROSCOPIC CHOLECYSTECTOMY;  Surgeon: Kieth Brightly, MD;  Location: [facility];  Service: General;  Laterality: N/A;   EYE SURGERY Bilateral 2015   HOLEP-LASER ENUCLEATION OF THE PROSTATE WITH MORCELLATION N/A 02/25/2022   Procedure: HOLEP-LASER ENUCLEATION OF THE PROSTATE WITH MORCELLATION;  Surgeon: Vanna Scotland, MD;  Location: [facility];  Service: Urology;  Laterality: N/A;   PROSTATE SURGERY N/A     Home Medications:  Allergies as of 07/25/2022       Reactions   Amoxicillin-pot Clavulanate Other (See Comments)   Erythromycin Itching, Swelling   Levofloxacin Itching   tongue   Sulfa Antibiotics    Other reaction(s): Unknown   Tetracycline    Other reaction(s): Unknown        Medication List        Accurate as of July 25, 2022 11:32 AM. If you have any questions, ask your nurse or doctor.          STOP taking these medications    ciprofloxacin 500 MG tablet Commonly known as: CIPRO  TAKE these medications    acetaminophen 325 MG tablet Commonly known as: TYLENOL Take by mouth every 6 (six) hours as needed for mild pain.   aspirin 81 MG chewable tablet Chew 81 mg by mouth daily.   atorvastatin 10 MG tablet Commonly known as: LIPITOR Take 10 mg by mouth daily.   calcium carbonate 750 MG chewable tablet Commonly known as: TUMS EX Chew 2 tablets by mouth as needed for heartburn.   cetirizine 10 MG tablet Commonly known as: ZYRTEC Take 10 mg by mouth daily.   cyanocobalamin 1000 MCG tablet Commonly known as: VITAMIN B12 Take 1,000 mcg by mouth daily.    diltiazem 240 MG 24 hr capsule Commonly known as: DILACOR XR TAKE 1 CAPSULE (240 MG TOTAL) BY MOUTH ONCE DAILY.   docusate sodium 100 MG capsule Commonly known as: COLACE Take 100 mg by mouth daily.   Farxiga 10 MG Tabs tablet Generic drug: dapagliflozin propanediol TAKE 1 TABLET BY MOUTH EVERY DAY IN THE MORNING   finasteride 5 MG tablet Commonly known as: PROSCAR Take 1 tablet (5 mg total) by mouth daily.   Gemtesa 75 MG Tabs Generic drug: Vibegron Take 75 mg by mouth daily.   glipiZIDE 10 MG tablet Commonly known as: GLUCOTROL Take 10 mg by mouth 2 (two) times daily before a meal.   hydrochlorothiazide 25 MG tablet Commonly known as: HYDRODIURIL Take 25 mg by mouth daily.   HYDROcodone-acetaminophen 5-325 MG tablet Commonly known as: NORCO/VICODIN Take 1-2 tablets by mouth every 6 (six) hours as needed for moderate pain.   magnesium oxide 400 MG tablet Commonly known as: MAG-OX TAKE 1 TABLET (400 MG TOTAL) BY MOUTH ONCE DAILY.   metFORMIN 1000 MG tablet Commonly known as: GLUCOPHAGE Take 1,000 mg by mouth 2 (two) times daily with a meal.   nystatin-triamcinolone ointment Commonly known as: MYCOLOG Apply 1 Application topically 2 (two) times daily.   phenylephrine 10 MG Tabs tablet Commonly known as: SUDAFED PE Take 10 mg by mouth every 4 (four) hours as needed.   tamsulosin 0.4 MG Caps capsule Commonly known as: FLOMAX TAKE 1 CAPSULE BY MOUTH TWICE A DAY 30 MINUTES AFTER BREAKFAST AND DINNER   traMADol 50 MG tablet Commonly known as: ULTRAM TAKE 1 TABLET (50 MG TOTAL) BY MOUTH EVERY 6 (SIX) HOURS AS NEEDED FOR PAIN FOR UP TO 30 DOSES   triamcinolone 0.025 % ointment Commonly known as: KENALOG Apply 1 Application topically 2 (two) times daily.        Allergies:  Allergies  Allergen Reactions   Amoxicillin-Pot Clavulanate Other (See Comments)   Erythromycin Itching and Swelling   Levofloxacin Itching    tongue   Sulfa Antibiotics     Other  reaction(s): Unknown   Tetracycline     Other reaction(s): Unknown    Family History: No family history on file.  Social History:  reports that he has never smoked. His smokeless tobacco use includes chew. He reports that he does not drink alcohol and does not use drugs.  ROS: Pertinent ROS in HPI  Physical Exam: BP 118/79   Pulse 96   Ht 5\' 10"  (1.778 m)   Wt 205 lb (93 kg)   BMI 29.41 kg/m   Constitutional:  Well nourished. Alert and oriented, No acute distress. HEENT: [place] AT, moist mucus membranes.  Trachea midline Cardiovascular: No clubbing, cyanosis, or edema. Respiratory: Normal respiratory effort, no increased work of breathing. GU: No CVA tenderness.  No bladder fullness or masses.  Patient with uncircumcised phallus. Foreskin easily retracted  Urethral meatus is patent.  No penile discharge. No penile lesions or rashes. Scrotum without lesions, cysts, rashes and/or edema.  Mild rash is appreciated in the inguinal area Neurologic: Grossly intact, no focal deficits, moving all 4 extremities. Psychiatric: Normal mood and affect.   Laboratory Data: Serum creatinine (07/2022) 1.1, eGFR 69 TSH ( 07/2022) 2.127 Total cholesterol (07/2022) 136 PSA (07/2022) 0.15 Hemoglobin A1c (07/2022) 7.4  Urinalysis w/Microscopic Order: [id number] Component Ref Range & Units 7 d ago  Color Colorless, Straw, Light Yellow, Yellow, Dark Yellow Light Yellow  Clarity Clear Clear  Specific Gravity 1.005 - 1.030 >1.030 High   pH, Urine 5.0 - 8.0 5.5  Protein, Urinalysis Negative mg/dL Negative  Glucose, Urinalysis Negative mg/dL 4+ Abnormal   Ketones, Urinalysis Negative mg/dL Negative  Blood, Urinalysis Negative Negative  Nitrite, Urinalysis Negative Negative  Leukocyte Esterase, Urinalysis Negative Negative  Bilirubin, Urinalysis Negative Negative  Urobilinogen, Urinalysis 0.2 - 1.0 mg/dL 0.2  WBC, UA <=5 /hpf 13 High   Red Blood Cells, Urinalysis <=3 /hpf 0  Bacteria,  Urinalysis 0 - 5 /hpf 0-5  Squamous Epithelial Cells, Urinalysis /hpf 0  Resulting Agency [facility]   Specimen Collected: 07/17/22 13:52   Performed by: [facility] Last Resulted: 07/17/22 14:55  Received From: [facility]  Result Received: 07/24/22 13:36  I have reviewed the labs.   Pertinent Imaging: N/A  Assessment & Plan:    1. Scrotal rash  -Resolving -I have given him refills on the Mycolog cream so that he can apply it when or if the rash becomes more severe  2. Incontinence -Encouraged him to continue with timed voids and explained why this is important  3. Prostate cancer -Keep appointment in August with Dr. Apolinar Junes  Return for Keep appointment with Dr. Apolinar Junes .  These notes generated with voice recognition software. I apologize for typographical errors.  Cloretta Ned  [facility] [address]  [address] [place], [place] [address] [phone]

## 2022-07-25 ENCOUNTER — Encounter: Payer: Self-pay | Admitting: Urology

## 2022-07-25 ENCOUNTER — Ambulatory Visit: Payer: Medicare HMO | Admitting: Urology

## 2022-07-25 ENCOUNTER — Ambulatory Visit: Payer: Medicare HMO | Admitting: Physical Therapy

## 2022-07-25 VITALS — BP 118/79 | HR 96 | Ht 70.0 in | Wt 205.0 lb

## 2022-07-25 DIAGNOSIS — R21 Rash and other nonspecific skin eruption: Secondary | ICD-10-CM

## 2022-07-25 DIAGNOSIS — N393 Stress incontinence (female) (male): Secondary | ICD-10-CM | POA: Diagnosis not present

## 2022-07-25 DIAGNOSIS — C61 Malignant neoplasm of prostate: Secondary | ICD-10-CM | POA: Diagnosis not present

## 2022-07-25 MED ORDER — NYSTATIN-TRIAMCINOLONE 100000-0.1 UNIT/GM-% EX OINT: 1.0000 | TOPICAL_OINTMENT | Freq: Two times a day (BID) | CUTANEOUS | 3 refills | Status: AC

## 2022-08-01 ENCOUNTER — Ambulatory Visit: Payer: Medicare HMO | Admitting: Physical Therapy

## 2022-08-08 ENCOUNTER — Ambulatory Visit: Payer: Medicare HMO | Admitting: Physical Therapy

## 2022-08-22 ENCOUNTER — Encounter: Payer: Medicare HMO | Admitting: Physical Therapy

## 2022-08-29 ENCOUNTER — Encounter: Payer: Medicare HMO | Admitting: Physical Therapy

## 2022-10-03 ENCOUNTER — Other Ambulatory Visit: Payer: Self-pay

## 2022-10-03 ENCOUNTER — Other Ambulatory Visit: Payer: Medicare HMO

## 2022-10-03 DIAGNOSIS — C61 Malignant neoplasm of prostate: Secondary | ICD-10-CM

## 2022-10-03 DIAGNOSIS — R21 Rash and other nonspecific skin eruption: Secondary | ICD-10-CM

## 2022-10-04 LAB — PSA: Prostate Specific Ag, Serum: 0.1 ng/mL (ref 0.0–4.0)

## 2022-10-08 ENCOUNTER — Ambulatory Visit: Payer: Medicare HMO | Admitting: Urology

## 2022-10-08 VITALS — BP 109/68 | HR 87 | Ht 70.0 in | Wt 205.0 lb

## 2022-10-08 DIAGNOSIS — N401 Enlarged prostate with lower urinary tract symptoms: Secondary | ICD-10-CM | POA: Diagnosis not present

## 2022-10-08 DIAGNOSIS — R339 Retention of urine, unspecified: Secondary | ICD-10-CM

## 2022-10-08 DIAGNOSIS — C61 Malignant neoplasm of prostate: Secondary | ICD-10-CM

## 2022-10-08 DIAGNOSIS — N393 Stress incontinence (female) (male): Secondary | ICD-10-CM

## 2022-10-08 LAB — BLADDER SCAN AMB NON-IMAGING: Scan Result: 1

## 2022-10-08 NOTE — Progress Notes (Signed)
I,Amy L Pierron,acting as a scribe for Vanna Scotland, MD.,have documented all relevant documentation on the behalf of Vanna Scotland, MD,as directed by  Vanna Scotland, MD while in the presence of Vanna Scotland, MD.  10/08/2022 2:40 PM   Andre Edwards [birth year] [id number]  Referring provider: Marguarite Arbour, MD [address] [facility] [place],  [place] [address]  Chief Complaint  Patient presents with   Follow-up    HPI: 77 year-old male with a personal history of BPH, status post HoLep, returns today for a routine annual follow-up.  Notably he also has a personal history of prostate cancer Gleason 3+3 involving 1% of the tissue examined on 03/06/2022. His pre-operative PSA was 6.44 and TRUS volume was 181. He was in chronic retention as well. His most recent post-operative PSA on 10/03/2022 is 0.1.   He continues on Gemtesa 75 mg and Flomax. He has worked with physical therapy a number of time for his stress incontinence.  He saw Carollee Herter for a scrotal rash in March 2024 for which he was prescribed Triamcinolone cream.   He said his incontinence is about twice as better than it was. He hasn't been very consistent doing his exercises at home. He still sleeps in diapers but wakes up dry sometimes. During the day he goes through about 2 or 3 pads a day. He has the clamp but hasn't used it yet.     IPSS     Row Name 10/08/22 1300         International Prostate Symptom Score   How often have you had the sensation of not emptying your bladder? Not at All     How often have you had to urinate less than every two hours? About half the time     How often have you found you stopped and started again several times when you urinated? Not at All     How often have you found it difficult to postpone urination? Not at All     How often have you had a weak urinary stream? Less than half the time     How often have you had to strain to start urination? Not at All     How  many times did you typically get up at night to urinate? None     Total IPSS Score 5       Quality of Life due to urinary symptoms   If you were to spend the rest of your life with your urinary condition just the way it is now how would you feel about that? Mixed            Score:  1-7 Mild 8-19 Moderate 20-35 Severe Results for orders placed or performed in visit on 10/08/22  BLADDER SCAN AMB NON-IMAGING  Result Value Ref Range   Scan Result 1 ml      PMH: Past Medical History:  Diagnosis Date   Cataract    Diabetes mellitus without complication (HCC)    Enlarged prostate    Hyperlipidemia    Hypertension    Pneumonia 02/11/2013   Sleep apnea    has not worn a CPAP    Surgical History: Past Surgical History:  Procedure Laterality Date   CHOLECYSTECTOMY     CHOLECYSTECTOMY N/A 07/04/2014   Procedure: LAPAROSCOPIC CHOLECYSTECTOMY;  Surgeon: Kieth Brightly, MD;  Location: [facility] ORS;  Service: General;  Laterality: N/A;   EYE SURGERY Bilateral 2015   HOLEP-LASER ENUCLEATION OF THE  PROSTATE WITH MORCELLATION N/A 02/25/2022   Procedure: HOLEP-LASER ENUCLEATION OF THE PROSTATE WITH MORCELLATION;  Surgeon: Vanna Scotland, MD;  Location: [facility] ORS;  Service: Urology;  Laterality: N/A;   PROSTATE SURGERY N/A     Home Medications:  Allergies as of 10/08/2022       Reactions   Amoxicillin-pot Clavulanate Other (See Comments)   Erythromycin Itching, Swelling   Levofloxacin Itching   tongue   Sulfa Antibiotics    Other reaction(s): Unknown   Tetracycline    Other reaction(s): Unknown        Medication List        Accurate as of October 08, 2022  2:40 PM. If you have any questions, ask your nurse or doctor.          STOP taking these medications    finasteride 5 MG tablet Commonly known as: PROSCAR   Gemtesa 75 MG Tabs Generic drug: Vibegron   tamsulosin 0.4 MG Caps capsule Commonly known as: FLOMAX       TAKE these medications     acetaminophen 325 MG tablet Commonly known as: TYLENOL Take by mouth every 6 (six) hours as needed for mild pain.   aspirin 81 MG chewable tablet Chew 81 mg by mouth daily.   atorvastatin 10 MG tablet Commonly known as: LIPITOR Take 10 mg by mouth daily.   calcium carbonate 750 MG chewable tablet Commonly known as: TUMS EX Chew 2 tablets by mouth as needed for heartburn.   cetirizine 10 MG tablet Commonly known as: ZYRTEC Take 10 mg by mouth daily.   cyanocobalamin 1000 MCG tablet Commonly known as: VITAMIN B12 Take 1,000 mcg by mouth daily.   diltiazem 240 MG 24 hr capsule Commonly known as: DILACOR XR TAKE 1 CAPSULE (240 MG TOTAL) BY MOUTH ONCE DAILY.   docusate sodium 100 MG capsule Commonly known as: COLACE Take 100 mg by mouth daily.   Farxiga 10 MG Tabs tablet Generic drug: dapagliflozin propanediol TAKE 1 TABLET BY MOUTH EVERY DAY IN THE MORNING   glipiZIDE 10 MG tablet Commonly known as: GLUCOTROL Take 10 mg by mouth 2 (two) times daily before a meal.   hydrochlorothiazide 25 MG tablet Commonly known as: HYDRODIURIL Take 25 mg by mouth daily.   HYDROcodone-acetaminophen 5-325 MG tablet Commonly known as: NORCO/VICODIN Take 1-2 tablets by mouth every 6 (six) hours as needed for moderate pain.   magnesium oxide 400 MG tablet Commonly known as: MAG-OX TAKE 1 TABLET (400 MG TOTAL) BY MOUTH ONCE DAILY.   metFORMIN 1000 MG tablet Commonly known as: GLUCOPHAGE Take 1,000 mg by mouth 2 (two) times daily with a meal.   nystatin-triamcinolone ointment Commonly known as: MYCOLOG Apply 1 Application topically 2 (two) times daily.   phenylephrine 10 MG Tabs tablet Commonly known as: SUDAFED PE Take 10 mg by mouth every 4 (four) hours as needed.   traMADol 50 MG tablet Commonly known as: ULTRAM TAKE 1 TABLET (50 MG TOTAL) BY MOUTH EVERY 6 (SIX) HOURS AS NEEDED FOR PAIN FOR UP TO 30 DOSES   triamcinolone 0.025 % ointment Commonly known as:  KENALOG Apply 1 Application topically 2 (two) times daily.        Allergies:  Allergies  Allergen Reactions   Amoxicillin-Pot Clavulanate Other (See Comments)   Erythromycin Itching and Swelling   Levofloxacin Itching    tongue   Sulfa Antibiotics     Other reaction(s): Unknown   Tetracycline     Other reaction(s): Unknown  Social History:  reports that he has never smoked. His smokeless tobacco use includes chew. He reports that he does not drink alcohol and does not use drugs.   Physical Exam: BP 109/68   Pulse 87   Ht 5\' 10"  (1.778 m)   Wt 205 lb (93 kg)   BMI 29.41 kg/m   Constitutional:  Alert and oriented, No acute distress. HEENT: [place] AT, moist mucus membranes.  Trachea midline, no masses. Neurologic: Grossly intact, no focal deficits, moving all 4 extremities. Psychiatric: Normal mood and affect.   Assessment & Plan:    Prostate cancer  - No evidence of disease. PSA is normalized to very low levels at 0.1.  2. BPH  - He is doing well. Able to discontinue Flomax and Finasteride. IPSS is excellent and urinary symptoms are well controlled.   3. Stress incontinence  - Improving; sometimes dry at night. Down to 2 pads per day.  - Reviewed penile clamp as an alternative.   - Encouraged to continue pelvic floor exercises, which he's not performing reliably at this point. Emphasized doing this daily to make continued progress forward.   Return in about 1 year (around 10/08/2023) for IPSS, PVR, PSA.   [facility] [address] [place], [place] [address] [phone]
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
## 2023-10-06 ENCOUNTER — Other Ambulatory Visit: Payer: Self-pay

## 2023-10-07 NOTE — Progress Notes (Deleted)
 10/08/2023 12:51 PM   Andre Edwards 1945/02/13 [id number]  Referring provider: Auston Reyes BIRCH, MD [address] [facility] [place],  [place] [address]  Urological history: 1. Prostate cancer -PSA (08/2023) 0.59 -found on HoLEP (02/2022) - INCIDENTAL PROSTATIC ACINAR ADENOCARCINOMA, GLEASON 3+3 (GRADE GROUP 1), INVOLVING 1% OF THE TISSUE EXAMINED. -has appointment in August  2. BPH with LU TS -s/p HoLEP (02/2022)   3. SUI - encouraged penile clamp use  No chief complaint on file.  HPI: Andre Edwards is a 78 y.o. male who presents today for yearly follow up.   Previous records reviewed.     PSA is 0.59, and serum creatinine was 1.2, eGFR 62 and UA positive for 4+ glucose in July.        PMH: Past Medical History:  Diagnosis Date   Cataract    Diabetes mellitus without complication (HCC)    Enlarged prostate    Hyperlipidemia    Hypertension    Pneumonia 02/11/2013   Sleep apnea    has not worn a CPAP    Surgical History: Past Surgical History:  Procedure Laterality Date   CHOLECYSTECTOMY     CHOLECYSTECTOMY N/A 07/04/2014   Procedure: LAPAROSCOPIC CHOLECYSTECTOMY;  Surgeon: Louanne KANDICE Muse, MD;  Location: [facility] ORS;  Service: General;  Laterality: N/A;   EYE SURGERY Bilateral 2015   HOLEP-LASER ENUCLEATION OF THE PROSTATE WITH MORCELLATION N/A 02/25/2022   Procedure: HOLEP-LASER ENUCLEATION OF THE PROSTATE WITH MORCELLATION;  Surgeon: Penne Knee, MD;  Location: [facility] ORS;  Service: Urology;  Laterality: N/A;   PROSTATE SURGERY N/A     Home Medications:  Allergies as of 10/08/2023       Reactions   Amoxicillin-pot Clavulanate Other (See Comments)   Erythromycin Itching, Swelling   Levofloxacin  Itching   tongue   Sulfa Antibiotics    Other reaction(s): Unknown   Tetracycline    Other reaction(s): Unknown        Medication List        Accurate as of October 07, 2023 12:51 PM. If you have any questions, ask your  nurse or doctor.          acetaminophen  325 MG tablet Commonly known as: TYLENOL  Take by mouth every 6 (six) hours as needed for mild pain.   aspirin 81 MG chewable tablet Chew 81 mg by mouth daily.   atorvastatin 10 MG tablet Commonly known as: LIPITOR Take 10 mg by mouth daily.   calcium carbonate 750 MG chewable tablet Commonly known as: TUMS EX Chew 2 tablets by mouth as needed for heartburn.   cetirizine 10 MG tablet Commonly known as: ZYRTEC Take 10 mg by mouth daily.   cyanocobalamin 1000 MCG tablet Commonly known as: VITAMIN B12 Take 1,000 mcg by mouth daily.   diltiazem 240 MG 24 hr capsule Commonly known as: DILACOR XR TAKE 1 CAPSULE (240 MG TOTAL) BY MOUTH ONCE DAILY.   docusate sodium 100 MG capsule Commonly known as: COLACE Take 100 mg by mouth daily.   Farxiga 10 MG Tabs tablet Generic drug: dapagliflozin propanediol TAKE 1 TABLET BY MOUTH EVERY DAY IN THE MORNING   glipiZIDE 10 MG tablet Commonly known as: GLUCOTROL Take 10 mg by mouth 2 (two) times daily before a meal.   hydrochlorothiazide 25 MG tablet Commonly known as: HYDRODIURIL Take 25 mg by mouth daily.   HYDROcodone -acetaminophen  5-325 MG tablet Commonly known as: NORCO/VICODIN Take 1-2 tablets by mouth every 6 (six) hours as needed  for moderate pain.   magnesium oxide 400 MG tablet Commonly known as: MAG-OX TAKE 1 TABLET (400 MG TOTAL) BY MOUTH ONCE DAILY.   metFORMIN 1000 MG tablet Commonly known as: GLUCOPHAGE Take 1,000 mg by mouth 2 (two) times daily with a meal.   nystatin -triamcinolone  ointment Commonly known as: MYCOLOG Apply 1 Application topically 2 (two) times daily.   phenylephrine  10 MG Tabs tablet Commonly known as: SUDAFED PE Take 10 mg by mouth every 4 (four) hours as needed.   traMADol 50 MG tablet Commonly known as: ULTRAM TAKE 1 TABLET (50 MG TOTAL) BY MOUTH EVERY 6 (SIX) HOURS AS NEEDED FOR PAIN FOR UP TO 30 DOSES   triamcinolone  0.025 %  ointment Commonly known as: KENALOG  Apply 1 Application topically 2 (two) times daily.        Allergies:  Allergies  Allergen Reactions   Amoxicillin-Pot Clavulanate Other (See Comments)   Erythromycin Itching and Swelling   Levofloxacin  Itching    tongue   Sulfa Antibiotics     Other reaction(s): Unknown   Tetracycline     Other reaction(s): Unknown    Family History: No family history on file.  Social History:  reports that he has never smoked. His smokeless tobacco use includes chew. He reports that he does not drink alcohol and does not use drugs.  ROS: Pertinent ROS in HPI  Physical Exam: There were no vitals taken for this visit.  Constitutional:  Well nourished. Alert and oriented, No acute distress. HEENT: [place] AT, moist mucus membranes.  Trachea midline, no masses. Cardiovascular: No clubbing, cyanosis, or edema. Respiratory: Normal respiratory effort, no increased work of breathing. GI: Abdomen is soft, non tender, non distended, no abdominal masses. Liver and spleen not palpable.  No hernias appreciated.  Stool sample for occult testing is not indicated.   GU: No CVA tenderness.  No bladder fullness or masses.  Patient with circumcised/uncircumcised phallus. ***Foreskin easily retracted***  Urethral meatus is patent.  No penile discharge. No penile lesions or rashes. Scrotum without lesions, cysts, rashes and/or edema.  Testicles are located scrotally bilaterally. No masses are appreciated in the testicles. Left and right epididymis are normal. Rectal: Patient with  normal sphincter tone. Anus and perineum without scarring or rashes. No rectal masses are appreciated. Prostate is approximately *** grams, *** nodules are appreciated. Seminal vesicles are normal. Skin: No rashes, bruises or suspicious lesions. Lymph: No cervical or inguinal adenopathy. Neurologic: Grossly intact, no focal deficits, moving all 4 extremities. Psychiatric: Normal mood and affect.    Laboratory Data: See HPI and EPIC  I have reviewed the labs.   Pertinent Imaging: N/A  Assessment & Plan:    1. Incontinence -***  2. Prostate cancer - low risk prostate cancer - PSA remains low  3. BPH with LU TS - s/p HoLEP and doing well  No follow-ups on file.  These notes generated with voice recognition software. I apologize for typographical errors.  CLOTILDA HELON RIGGERS  [facility] [address]  [address] [place], [place] [address] [phone]

## 2023-10-08 ENCOUNTER — Ambulatory Visit: Payer: Self-pay | Admitting: Urology

## 2023-10-08 ENCOUNTER — Ambulatory Visit: Admitting: Urology

## 2023-10-08 DIAGNOSIS — N138 Other obstructive and reflux uropathy: Secondary | ICD-10-CM

## 2023-10-08 DIAGNOSIS — N393 Stress incontinence (female) (male): Secondary | ICD-10-CM

## 2023-10-08 DIAGNOSIS — C61 Malignant neoplasm of prostate: Secondary | ICD-10-CM
